# Patient Record
Sex: Female | Born: 1975 | Race: Black or African American | Hispanic: No | Marital: Married | Smoking: Current every day smoker
Health system: Southern US, Community
[De-identification: ages and names within clinical notes are randomized; demographics above are authoritative.]

## PROBLEM LIST (undated history)

## (undated) DIAGNOSIS — J45909 Unspecified asthma, uncomplicated: Secondary | ICD-10-CM

## (undated) DIAGNOSIS — T7840XA Allergy, unspecified, initial encounter: Secondary | ICD-10-CM

## (undated) DIAGNOSIS — F419 Anxiety disorder, unspecified: Secondary | ICD-10-CM

## (undated) DIAGNOSIS — K219 Gastro-esophageal reflux disease without esophagitis: Secondary | ICD-10-CM

## (undated) DIAGNOSIS — D509 Iron deficiency anemia, unspecified: Secondary | ICD-10-CM

## (undated) DIAGNOSIS — Z6841 Body Mass Index (BMI) 40.0 and over, adult: Secondary | ICD-10-CM

## (undated) DIAGNOSIS — F32A Depression, unspecified: Secondary | ICD-10-CM

## (undated) HISTORY — DX: Depression, unspecified: F32.A

## (undated) HISTORY — DX: Allergy, unspecified, initial encounter: T78.40XA

## (undated) HISTORY — PX: TUBAL LIGATION: SHX77

## (undated) HISTORY — DX: Iron deficiency anemia, unspecified: D50.9

## (undated) HISTORY — DX: Unspecified asthma, uncomplicated: J45.909

## (undated) HISTORY — PX: DILATION AND CURETTAGE OF UTERUS: SHX78

## (undated) HISTORY — PX: TONSILLECTOMY: SUR1361

## (undated) HISTORY — DX: Anxiety disorder, unspecified: F41.9

---

## 2005-12-14 ENCOUNTER — Emergency Department: Payer: Self-pay | Admitting: Unknown Physician Specialty

## 2008-10-02 ENCOUNTER — Emergency Department: Payer: Self-pay | Admitting: Emergency Medicine

## 2012-10-19 ENCOUNTER — Emergency Department: Payer: Self-pay | Admitting: Emergency Medicine

## 2012-10-19 LAB — CBC
HGB: 11.7 g/dL — ABNORMAL LOW (ref 12.0–16.0)
MCH: 24.3 pg — ABNORMAL LOW (ref 26.0–34.0)
MCV: 75 fL — ABNORMAL LOW (ref 80–100)
WBC: 10.2 10*3/uL (ref 3.6–11.0)

## 2012-10-19 LAB — PRO B NATRIURETIC PEPTIDE: B-Type Natriuretic Peptide: 60 pg/mL (ref 0–125)

## 2012-10-19 LAB — URINALYSIS, COMPLETE
Bacteria: NONE SEEN
Blood: NEGATIVE
Glucose,UR: NEGATIVE mg/dL (ref 0–75)
Ketone: NEGATIVE
Nitrite: NEGATIVE
Protein: NEGATIVE
Specific Gravity: 1.014 (ref 1.003–1.030)
Squamous Epithelial: <1
WBC UR: 1 /HPF (ref 0–5)

## 2012-10-19 LAB — TSH: Thyroid Stimulating Horm: 1.54 u[IU]/mL

## 2012-10-19 LAB — COMPREHENSIVE METABOLIC PANEL
Albumin: 2.9 g/dL — ABNORMAL LOW (ref 3.4–5.0)
Alkaline Phosphatase: 81 U/L (ref 50–136)
Anion Gap: 11 (ref 7–16)
BUN: 7 mg/dL (ref 7–18)
Calcium, Total: 8.2 mg/dL — ABNORMAL LOW (ref 8.5–10.1)
Chloride: 108 mmol/L — ABNORMAL HIGH (ref 98–107)
Creatinine: 0.75 mg/dL (ref 0.60–1.30)
Glucose: 91 mg/dL (ref 65–99)
Osmolality: 281 (ref 275–301)
Potassium: 3.7 mmol/L (ref 3.5–5.1)
Sodium: 142 mmol/L (ref 136–145)
Total Protein: 7.2 g/dL (ref 6.4–8.2)

## 2012-10-19 LAB — TROPONIN I: Troponin-I: <0.02 ng/mL

## 2014-05-28 ENCOUNTER — Ambulatory Visit: Payer: Self-pay

## 2015-06-14 ENCOUNTER — Encounter: Payer: Self-pay | Admitting: Emergency Medicine

## 2015-06-14 ENCOUNTER — Emergency Department
Admission: EM | Admit: 2015-06-14 | Discharge: 2015-06-14 | Disposition: A | Discharge status: Home / Self Care | Payer: Self-pay | Attending: Emergency Medicine | Admitting: Emergency Medicine

## 2015-06-14 DIAGNOSIS — R300 Dysuria: Secondary | ICD-10-CM | POA: Insufficient documentation

## 2015-06-14 DIAGNOSIS — Z720 Tobacco use: Secondary | ICD-10-CM | POA: Insufficient documentation

## 2015-06-14 DIAGNOSIS — R1030 Lower abdominal pain, unspecified: Secondary | ICD-10-CM | POA: Insufficient documentation

## 2015-06-14 LAB — URINALYSIS COMPLETE WITH MICROSCOPIC (ARMC ONLY)
Bilirubin Urine: NEGATIVE
Glucose, UA: NEGATIVE mg/dL
KETONES UR: NEGATIVE mg/dL
LEUKOCYTES UA: NEGATIVE
Nitrite: NEGATIVE
Protein, ur: NEGATIVE mg/dL
Specific Gravity, Urine: 1.021 (ref 1.005–1.030)
pH: 5 (ref 5.0–8.0)

## 2015-06-14 NOTE — ED Provider Notes (Signed)
[facility] Emergency Department Provider Note ____________________________________________  Time seen: 7:55 pm  I have reviewed the triage vital signs and the nursing notes.  HISTORY  Chief Complaint  Urinary Frequency  HPI Alexa Osborne is a 39 y.o. female with 3 days of urinary frequency, urgency, and dysuria. She denies any outright hematuria, or vaginal discharge. She describes some mild lower pelvic pain, but is not concerned with any sexually transmitted exposures. She denies any fever, chills, sweats, but notes some mild nausea without vomiting. She reports that her last missed her period was on June 27.  No past medical history on file.  There are no active problems to display for this patient.  No past surgical history on file.  No current outpatient prescriptions on file.  Allergies Review of patient's allergies indicates no known allergies.  No family history on file.  Social History History  Substance Use Topics  . Smoking status: Current Every Day Smoker -- 1.00 packs/day    Types: Cigarettes  . Smokeless tobacco: Never Used  . Alcohol Use: No   Review of Systems  Constitutional: Negative for fever. Eyes: Negative for visual changes. ENT: Negative for sore throat. Cardiovascular: Negative for chest pain. Respiratory: Negative for shortness of breath. Gastrointestinal: Negative for abdominal pain, vomiting and diarrhea. Genitourinary: Positive for dysuria. Denies vaginal discharge.  Musculoskeletal: Negative for back pain. Skin: Negative for rash. Neurological: Negative for headaches, focal weakness or numbness. ____________________________________________  PHYSICAL EXAM:  VITAL SIGNS: ED Triage Vitals  Enc Vitals Group     BP 06/14/15 1914 146/89 mmHg     Pulse Rate 06/14/15 1914 83     Resp 06/14/15 1914 22     Temp 06/14/15 1914 98.4 F (36.9 C)     Temp Source 06/14/15 1914 Oral     SpO2 06/14/15 1914 96 %      Weight 06/14/15 1914 350 lb (158.759 kg)     Height 06/14/15 1914 5\' 2"  (1.575 m)     Head Cir --      Peak Flow --      Pain Score 06/14/15 1915 8     Pain Loc --      Pain Edu? --      Excl. in [place]? --    Constitutional: Alert and oriented. Well appearing and in no distress. Eyes: Conjunctivae are normal. PERRL. Normal extraocular movements. ENT   Head: Normocephalic and atraumatic.   Nose: No congestion/rhinnorhea.   Mouth/Throat: Mucous membranes are moist.   Neck: Supple. No thyromegaly. Cardiovascular: Normal rate, regular rhythm.  Respiratory: Normal respiratory effort. No wheezes/rales/rhonchi. Gastrointestinal: Soft and nontender. No distention. No CVA tenderness GU:  Declined by patient Musculoskeletal: Nontender with normal range of motion in all extremities.  Neurologic:  Normal gait without ataxia. Normal speech and language. No gross focal neurologic deficits are appreciated. Skin:  Skin is warm, dry and intact. No rash noted. Psychiatric: Mood and affect are normal. Patient exhibits appropriate insight and judgment. ____________________________________________   LABS (pertinent positives/negatives) Labs Reviewed  URINALYSIS COMPLETEWITH MICROSCOPIC ([place] ONLY) - Abnormal; Notable for the following:    Color, Urine YELLOW (*)    APPearance CLEAR (*)    Hgb urine dipstick 2+ (*)    Bacteria, UA RARE (*)    Squamous Epithelial / LPF 0-5 (*)    All other components within normal limits   INITIAL IMPRESSION / ASSESSMENT AND PLAN / ED COURSE Dysuria without clinical indication of acute cystitis.  Patient declined  offer for pelvic exam and wet prep.  Will follow-up with Dr. Denton Lank, as scheduled on Tuesday.  Consider OTC Azo for symptom relief. ____________________________________________  FINAL CLINICAL IMPRESSION(S) / ED DIAGNOSES  Final diagnoses:  Dysuria     Melvenia Needles, PA-C 06/14/15 2055  Nena Polio, MD 06/15/15 [phone]

## 2015-06-14 NOTE — Discharge Instructions (Signed)
Dysuria Dysuria is the medical term for pain with urination. There are many causes for dysuria, but urinary tract infection is the most common. If a urinalysis was performed it can show that there is a urinary tract infection. A urine culture confirms that you or your child is sick. You will need to follow up with a healthcare provider because:  If a urine culture was done you will need to know the culture results and treatment recommendations.  If the urine culture was positive, you or your child will need to be put on antibiotics or know if the antibiotics prescribed are the right antibiotics for your urinary tract infection.  If the urine culture is negative (no urinary tract infection), then other causes may need to be explored or antibiotics need to be stopped. Today laboratory work may have been done and there does not seem to be an infection. If cultures were done they will take at least 24 to 48 hours to be completed. Today x-rays may have been taken and they read as normal. No cause can be found for the problems. The x-rays may be re-read by a radiologist and you will be contacted if additional findings are made. You or your child may have been put on medications to help with this problem until you can see your primary caregiver. If the problems get better, see your primary caregiver if the problems return. If you were given antibiotics (medications which kill germs), take all of the mediations as directed for the full course of treatment.  If laboratory work was done, you need to find the results. Leave a telephone number where you can be reached. If this is not possible, make sure you find out how you are to get test results. HOME CARE INSTRUCTIONS   Drink lots of fluids. For adults, drink eight, 8 ounce glasses of clear juice or water a day. For children, replace fluids as suggested by your caregiver.  Empty the bladder often. Avoid holding urine for long periods of time.  After a bowel  movement, women should cleanse front to back, using each tissue only once.  Empty your bladder before and after sexual intercourse.  Take all the medicine given to you until it is gone. You may feel better in a few days, but TAKE ALL MEDICINE.  Avoid caffeine, tea, alcohol and carbonated beverages, because they tend to irritate the bladder.  In men, alcohol may irritate the prostate.  Only take over-the-counter or prescription medicines for pain, discomfort, or fever as directed by your caregiver.  If your caregiver has given you a follow-up appointment, it is very important to keep that appointment. Not keeping the appointment could result in a chronic or permanent injury, pain, and disability. If there is any problem keeping the appointment, you must call back to this facility for assistance. SEEK IMMEDIATE MEDICAL CARE IF:   Back pain develops.  A fever develops.  There is nausea (feeling sick to your stomach) or vomiting (throwing up).  Problems are no better with medications or are getting worse. MAKE SURE YOU:   Understand these instructions.  Will watch your condition.  Will get help right away if you are not doing well or get worse. Document Released: 08/26/2004 Document Revised: 02/20/2012 Document Reviewed: 07/03/2008 [facility] Patient Information 2015 [place], [place]. This information is not intended to replace advice given to you by your health care provider. Make sure you discuss any questions you have with your health care provider.  Increase fluid intake  and empty bladder on schedule.  Consider taking OTC Azo for bladder discomfort.  You should keep your appointment with Dr. Posey Pronto as scheduled for further care & management.

## 2015-06-14 NOTE — ED Notes (Signed)
Patient reports burning, frequency and lower abd pain.

## 2017-01-23 ENCOUNTER — Emergency Department
Admission: EM | Admit: 2017-01-23 | Discharge: 2017-01-23 | Disposition: A | Discharge status: Home / Self Care | Payer: Self-pay | Attending: Emergency Medicine | Admitting: Emergency Medicine

## 2017-01-23 ENCOUNTER — Encounter: Payer: Self-pay | Admitting: Emergency Medicine

## 2017-01-23 DIAGNOSIS — B9689 Other specified bacterial agents as the cause of diseases classified elsewhere: Secondary | ICD-10-CM

## 2017-01-23 DIAGNOSIS — F17210 Nicotine dependence, cigarettes, uncomplicated: Secondary | ICD-10-CM | POA: Insufficient documentation

## 2017-01-23 DIAGNOSIS — N760 Acute vaginitis: Secondary | ICD-10-CM | POA: Insufficient documentation

## 2017-01-23 DIAGNOSIS — R1030 Lower abdominal pain, unspecified: Secondary | ICD-10-CM

## 2017-01-23 DIAGNOSIS — N898 Other specified noninflammatory disorders of vagina: Secondary | ICD-10-CM

## 2017-01-23 LAB — WET PREP, GENITAL
Sperm: NONE SEEN
Trich, Wet Prep: NONE SEEN
Yeast Wet Prep HPF POC: NONE SEEN

## 2017-01-23 LAB — URINALYSIS, COMPLETE (UACMP) WITH MICROSCOPIC
BACTERIA UA: NONE SEEN
Bilirubin Urine: NEGATIVE
GLUCOSE, UA: NEGATIVE mg/dL
HGB URINE DIPSTICK: NEGATIVE
KETONES UR: NEGATIVE mg/dL
Leukocytes, UA: NEGATIVE
NITRITE: NEGATIVE
PROTEIN: NEGATIVE mg/dL
Specific Gravity, Urine: 1.02 (ref 1.005–1.030)
pH: 5 (ref 5.0–8.0)

## 2017-01-23 LAB — COMPREHENSIVE METABOLIC PANEL
ALBUMIN: 3.8 g/dL (ref 3.5–5.0)
ALT: 10 U/L — ABNORMAL LOW (ref 14–54)
ANION GAP: 6 (ref 5–15)
AST: 13 U/L — ABNORMAL LOW (ref 15–41)
Alkaline Phosphatase: 59 U/L (ref 38–126)
BUN: 13 mg/dL (ref 6–20)
CALCIUM: 9 mg/dL (ref 8.9–10.3)
CO2: 24 mmol/L (ref 22–32)
Chloride: 106 mmol/L (ref 101–111)
Creatinine, Ser: 0.73 mg/dL (ref 0.44–1.00)
GFR calc non Af Amer: >60 mL/min (ref >60)
GLUCOSE: 90 mg/dL (ref 65–99)
POTASSIUM: 4.1 mmol/L (ref 3.5–5.1)
SODIUM: 136 mmol/L (ref 135–145)
Total Bilirubin: 0.4 mg/dL (ref 0.3–1.2)
Total Protein: 7.3 g/dL (ref 6.5–8.1)

## 2017-01-23 LAB — CBC
HEMATOCRIT: 39 % (ref 35.0–47.0)
HEMOGLOBIN: 12.9 g/dL (ref 12.0–16.0)
MCH: 26.4 pg (ref 26.0–34.0)
MCHC: 33.2 g/dL (ref 32.0–36.0)
MCV: 79.5 fL — ABNORMAL LOW (ref 80.0–100.0)
Platelets: 209 10*3/uL (ref 150–440)
RBC: 4.9 MIL/uL (ref 3.80–5.20)
RDW: 14.7 % — ABNORMAL HIGH (ref 11.5–14.5)
WBC: 13.3 10*3/uL — ABNORMAL HIGH (ref 3.6–11.0)

## 2017-01-23 LAB — LIPASE, BLOOD: LIPASE: 34 U/L (ref 11–51)

## 2017-01-23 LAB — POCT PREGNANCY, URINE: PREG TEST UR: NEGATIVE

## 2017-01-23 LAB — CHLAMYDIA/NGC RT PCR (ARMC ONLY)
Chlamydia Tr: NOT DETECTED
N gonorrhoeae: NOT DETECTED

## 2017-01-23 MED ORDER — ACETAMINOPHEN 500 MG PO TABS
1000.0000 mg | ORAL_TABLET | Freq: Once | ORAL | Status: AC
  Administered 2017-01-23: 1000 mg via ORAL
  Filled 2017-01-23: qty 2

## 2017-01-23 MED ORDER — FLUCONAZOLE 150 MG PO TABS: 150.0000 mg | ORAL_TABLET | Freq: Every day | ORAL | 0 refills | Status: AC

## 2017-01-23 MED ORDER — METRONIDAZOLE 500 MG PO TABS: 500.0000 mg | ORAL_TABLET | Freq: Two times a day (BID) | ORAL | 0 refills | Status: DC

## 2017-01-23 NOTE — ED Provider Notes (Signed)
[facility] Emergency Department Provider Note  ____________________________________________  Time seen: Approximately 10:37 AM  I have reviewed the triage vital signs and the nursing notes.   HISTORY  Chief Complaint Abdominal Pain    HPI Alexa Osborne is a 41 y.o. female , nonpregnant, who is sexually active with one female partner, remote history of STD, presenting with 1 week of intermittent mild lower abdominal pain, and increased white colored vaginal discharge. The patient denies any fever, chills, nausea vomiting or diarrhea. She has not had dysuria.No improvement with Motrin.   History reviewed. No pertinent past medical history.  There are no active problems to display for this patient.   History reviewed. No pertinent surgical history.    Allergies Patient has no known allergies.  No family history on file.  Social History Social History  Substance Use Topics  . Smoking status: Current Every Day Smoker    Packs/day: 1.00    Types: Cigarettes  . Smokeless tobacco: Never Used  . Alcohol use No    Review of Systems Constitutional: No fever/chills.No lightheadedness or syncope. Eyes: No visual changes. ENT: No sore throat. No congestion or rhinorrhea. Cardiovascular: Denies chest pain. Denies palpitations. Respiratory: Denies shortness of breath.  No cough. Gastrointestinal: Positive lower abdominal pain.  No nausea, no vomiting.  No diarrhea.  No constipation. Genitourinary: Negative for dysuria. No urinary frequency. Positive white vaginal discharge. Musculoskeletal: Negative for back pain. Skin: Negative for rash. Neurological: Negative for headaches. No focal numbness, tingling or weakness.   10-point ROS otherwise negative.  ____________________________________________   PHYSICAL EXAM:  VITAL SIGNS: ED Triage Vitals  Enc Vitals Group     BP 01/23/17 0814 116/62     Pulse Rate 01/23/17 0814 79     Resp 01/23/17 0814  18     Temp 01/23/17 0814 98.3 F (36.8 C)     Temp Source 01/23/17 0814 Oral     SpO2 01/23/17 0814 99 %     Weight 01/23/17 0815 279 lb (126.6 kg)     Height 01/23/17 0815 5\' 2"  (1.575 m)     Head Circumference --      Peak Flow --      Pain Score 01/23/17 0823 6     Pain Loc --      Pain Edu? --      Excl. in [place]? --     Constitutional: Alert and oriented. Well appearing and in no acute distress. Answers questions appropriately. Eyes: Conjunctivae are normal.  EOMI. No scleral icterus. Head: Atraumatic. Nose: No congestion/rhinnorhea. Mouth/Throat: Mucous membranes are moist.  Neck: No stridor.  Supple.   Cardiovascular: Normal rate, regular rhythm. No murmurs, rubs or gallops.  Respiratory: Normal respiratory effort.  No accessory muscle use or retractions. Lungs CTAB.  No wheezes, rales or ronchi. Gastrointestinal: Morbidly obese Soft, nontender and nondistended.  I'm unable to reproduce any pain with palpation. No guarding or rebound.  No peritoneal signs. Genitourinary: Normal-appearing external genitalia without lesions. Vaginal exam with thin white discharge, normal-appearing cervix, normal vaginal wall tissue. Bimanual exam is negative for CMT, adnexal tenderness to palpation, no palpable masses.  Mild ttp in the suprapubic area. Musculoskeletal: No LE edema.  Neurologic:  A&Ox3.  Speech is clear.  Face and smile are symmetric.  EOMI.  Moves all extremities well. Skin:  Skin is warm, dry and intact. No rash noted. Psychiatric: Mood and affect are normal. Speech and behavior are normal.  Normal judgement.  ____________________________________________  LABS (all labs ordered are listed, but only abnormal results are displayed)  Labs Reviewed  WET PREP, GENITAL - Abnormal; Notable for the following:       Result Value   Clue Cells Wet Prep HPF POC PRESENT (*)    WBC, Wet Prep HPF POC MODERATE (*)    All other components within normal limits  COMPREHENSIVE METABOLIC  PANEL - Abnormal; Notable for the following:    AST 13 (*)    ALT 10 (*)    All other components within normal limits  CBC - Abnormal; Notable for the following:    WBC 13.3 (*)    MCV 79.5 (*)    RDW 14.7 (*)    All other components within normal limits  URINALYSIS, COMPLETE (UACMP) WITH MICROSCOPIC - Abnormal; Notable for the following:    Color, Urine YELLOW (*)    APPearance CLEAR (*)    Squamous Epithelial / LPF 0-5 (*)    All other components within normal limits  CHLAMYDIA/NGC RT PCR ([facility] ONLY)  LIPASE, BLOOD  POC URINE PREG, ED  POCT PREGNANCY, URINE   ____________________________________________  EKG  Not indicated ____________________________________________  RADIOLOGY  No results found.  ____________________________________________   PROCEDURES  Procedure(s) performed: None  Procedures  Critical Care performed: No ____________________________________________   INITIAL IMPRESSION / ASSESSMENT AND PLAN / ED COURSE  Pertinent labs & imaging results that were available during my care of the patient were reviewed by me and considered in my medical decision making (see chart for details).  41 y.o. female presenting with intermittent lower abdominal pain, increased white vaginal discharge times one week. Overall, the patient has a reassuring examination and stable vital signs. Her laboratory studies do show an elevation of her white blood cells to 13.3, but no evidence of UTI or other electrolyte abnormalities. Her pregnancy test is also negative. We will perform pelvic examination with cultures, and I will treat the patient with Tylenol for her pain.  ----------------------------------------- 12:44 PM on 01/23/2017 -----------------------------------------  The patient's wet prep does show clue cells, so treated her with Flagyl for a week for bacterial vaginosis. She has these results. Her GC and chlamydia are still pending but she is unwilling to  wait for the results and will have her gynecologist follow these up. She understands return precautions as well as follow-up instructions. She has requested a tablet of Diflucan as she is prone to yeast infections with other antibiotics. ____________________________________________  FINAL CLINICAL IMPRESSION(S) / ED DIAGNOSES  Final diagnoses:  Vaginal discharge  Lower abdominal pain  Bacterial vaginosis         NEW MEDICATIONS STARTED DURING THIS VISIT:  New Prescriptions   FLUCONAZOLE (DIFLUCAN) 150 MG TABLET    Take 1 tablet (150 mg total) by mouth daily.   METRONIDAZOLE (FLAGYL) 500 MG TABLET    Take 1 tablet (500 mg total) by mouth 2 (two) times daily.      Eula Listen, MD 01/23/17 1245

## 2017-01-23 NOTE — Discharge Instructions (Signed)
Please take the entire course of Flagyl, even if you're feeling better. You may take Tylenol or Motrin for your abdominal discomfort. Please have your gynecologist follow up the results of your gonorrhea and chlamydia testing e positive or one of those STDs, he may be at risk for other STDs including HIV. Please have your doctor check you for HIV. Please refrain from any sexual intercourse until you have the results of all of your studies and your partner has been tested and treated as necessary.  Return to the emergency department if you develop severe pain, vomiting, fever, or any other

## 2017-01-23 NOTE — ED Triage Notes (Signed)
Lower abdominal pain x 1 week. Denies fevers, States has white vaginal discharge.

## 2017-02-26 ENCOUNTER — Emergency Department
Admission: EM | Admit: 2017-02-26 | Discharge: 2017-02-26 | Disposition: A | Discharge status: Home / Self Care | Payer: Self-pay | Attending: Emergency Medicine | Admitting: Emergency Medicine

## 2017-02-26 ENCOUNTER — Encounter: Payer: Self-pay | Admitting: Emergency Medicine

## 2017-02-26 DIAGNOSIS — F17210 Nicotine dependence, cigarettes, uncomplicated: Secondary | ICD-10-CM | POA: Insufficient documentation

## 2017-02-26 DIAGNOSIS — J019 Acute sinusitis, unspecified: Secondary | ICD-10-CM | POA: Insufficient documentation

## 2017-02-26 MED ORDER — FLUCONAZOLE 150 MG PO TABS: 150.0000 mg | ORAL_TABLET | Freq: Every day | ORAL | 0 refills | Status: DC

## 2017-02-26 MED ORDER — PREDNISONE 10 MG (21) PO TBPK: ORAL_TABLET | ORAL | 0 refills | Status: DC

## 2017-02-26 MED ORDER — AMOXICILLIN-POT CLAVULANATE 875-125 MG PO TABS: 1.0000 | ORAL_TABLET | Freq: Two times a day (BID) | ORAL | 0 refills | Status: AC

## 2017-02-26 NOTE — ED Triage Notes (Signed)
Pt states that she has had cough and congestion, bilateral ear pain, sore throat, and chills off and on for 2 weeks. Pt states that her symptoms are getting worse. Pt in no acute distress at this time

## 2017-02-26 NOTE — ED Notes (Addendum)
See triage note  States she developed cough, congestion, ear pain for about 2 weeks  Sx;shave changed in intensity  Feels like she is getting worse over the past couple of day  Afebrile on arrival

## 2017-02-26 NOTE — ED Provider Notes (Signed)
[facility] Emergency Department Provider Note  ____________________________________________  Time seen: Approximately 9:16 AM  I have reviewed the triage vital signs and the nursing notes.   HISTORY  Chief Complaint Cough    HPI Alexa Osborne is a 41 y.o. female presents to emergency department with 2 weeks of congestion, runny nose, ear pain. Patient states that it hurts over her cheeks and forehead. Patient states that drainage is going down the back of her throat and in the last 3 days has developed a sore throat and cough. Patient is coughing up white sputum. She states that she frequently she gets a "head cold" but they usually resolve by now. Patient has used over-the-counter medications without relief. She denies fever, headache, shortness of breath, chest pain, nausea, vomiting, abdominal pain, diarrhea, constipation.   History reviewed. No pertinent past medical history.  There are no active problems to display for this patient.   Past Surgical History:  Procedure Laterality Date  . DILATION AND CURETTAGE OF UTERUS    . TONSILLECTOMY    . TUBAL LIGATION      Prior to Admission medications   Medication Sig Start Date End Date Taking? Authorizing Provider  amoxicillin-clavulanate (AUGMENTIN) 875-125 MG tablet Take 1 tablet by mouth 2 (two) times daily. 02/26/17 03/08/17  Laban Emperor, PA-C  fluconazole (DIFLUCAN) 150 MG tablet Take 1 tablet (150 mg total) by mouth daily. 02/26/17   Laban Emperor, PA-C  metroNIDAZOLE (FLAGYL) 500 MG tablet Take 1 tablet (500 mg total) by mouth 2 (two) times daily. 01/23/17   Anne-Caroline Mariea Clonts, MD  predniSONE (STERAPRED UNI-PAK 21 TAB) 10 MG (21) TBPK tablet Take 6 tablets on day 1, take 5 tablets on day 2, take 4 tablets on day 3, take 3 tablets on day 4, take 2 tablets on day 5, take 1 tablet on day 6 02/26/17   Laban Emperor, PA-C    Allergies Patient has no known allergies.  No family history on  file.  Social History Social History  Substance Use Topics  . Smoking status: Current Every Day Smoker    Packs/day: 1.00    Types: Cigarettes  . Smokeless tobacco: Never Used  . Alcohol use No     Review of Systems  Constitutional: No fever/chills Eyes: No visual changes. No discharge. ENT: Positive for congestion and rhinorrhea. Cardiovascular: No chest pain. Respiratory: Positive for cough. No SOB. Gastrointestinal: No abdominal pain.  No nausea, no vomiting.  No diarrhea.  No constipation. Musculoskeletal: Negative for musculoskeletal pain. Skin: Negative for rash, abrasions, lacerations, ecchymosis. Neurological: Negative for headaches.   ____________________________________________   PHYSICAL EXAM:  VITAL SIGNS: ED Triage Vitals  Enc Vitals Group     BP 02/26/17 0853 130/78     Pulse Rate 02/26/17 0853 89     Resp 02/26/17 0853 16     Temp 02/26/17 0853 97.9 F (36.6 C)     Temp Source 02/26/17 0853 Oral     SpO2 02/26/17 0853 97 %     Weight 02/26/17 0851 275 lb (124.7 kg)     Height 02/26/17 0851 5\' 3"  (1.6 m)     Head Circumference --      Peak Flow --      Pain Score 02/26/17 0851 8     Pain Loc --      Pain Edu? --      Excl. in [place]? --      Constitutional: Alert and oriented. Well appearing and in no acute  distress. Eyes: Conjunctivae are normal. PERRL. EOMI. No discharge. Head: Atraumatic. ENT: Positive for frontal and maxillary sinus tenderness.      Ears: Tympanic membranes pearly gray with good landmarks. No discharge.      Nose: Mild congestion/rhinnorhea.      Mouth/Throat: Mucous membranes are moist. Oropharynx non-erythematous. Tonsils not enlarged. No exudates. Uvula midline. Neck: No stridor.   Hematological/Lymphatic/Immunilogical: No cervical lymphadenopathy. Cardiovascular: Normal rate, regular rhythm.  Good peripheral circulation. Respiratory: Normal respiratory effort without tachypnea or retractions. Lungs CTAB. Good air entry to  the bases with no decreased or absent breath sounds. Musculoskeletal: Full range of motion to all extremities. No gross deformities appreciated. Neurologic:  Normal speech and language. No gross focal neurologic deficits are appreciated.  Skin:  Skin is warm, dry and intact. No rash noted. Psychiatric: Mood and affect are normal.    ____________________________________________   LABS (all labs ordered are listed, but only abnormal results are displayed)  Labs Reviewed - No data to display ____________________________________________  EKG   ____________________________________________  RADIOLOGY  No results found.  ____________________________________________    PROCEDURES  Procedure(s) performed:    Procedures    Medications - No data to display   ____________________________________________   INITIAL IMPRESSION / ASSESSMENT AND PLAN / ED COURSE  Pertinent labs & imaging results that were available during my care of the patient were reviewed by me and considered in my medical decision making (see chart for details).  Review of the [place] CSRS was performed in accordance of the [place] prior to dispensing any controlled drugs.     Patient's diagnosis is consistent with sinus infection. Vital signs and exam are reassuring. Patient has not had a fever and is afebrile in ED. I do not think there is no indication for imaging at this time. Patient appears well and is staying well hydrated.  Patient feels comfortable going home. Patient will be discharged home with prescriptions for Augmentin and prednisone. Patient states that she gets yeast infections from Augmentin so a prescription for Diflucan was provided. Patient is to follow up with PCP as needed or otherwise directed. Patient is given ED precautions to return to the ED for any worsening or new symptoms.     ____________________________________________  FINAL CLINICAL IMPRESSION(S) / ED DIAGNOSES  Final  diagnoses:  Acute non-recurrent sinusitis, unspecified location      NEW MEDICATIONS STARTED DURING THIS VISIT:  New Prescriptions   AMOXICILLIN-CLAVULANATE (AUGMENTIN) 875-125 MG TABLET    Take 1 tablet by mouth 2 (two) times daily.   FLUCONAZOLE (DIFLUCAN) 150 MG TABLET    Take 1 tablet (150 mg total) by mouth daily.   PREDNISONE (STERAPRED UNI-PAK 21 TAB) 10 MG (21) TBPK TABLET    Take 6 tablets on day 1, take 5 tablets on day 2, take 4 tablets on day 3, take 3 tablets on day 4, take 2 tablets on day 5, take 1 tablet on day 6        This chart was dictated using voice recognition software/Dragon. Despite best efforts to proofread, errors can occur which can change the meaning. Any change was purely unintentional.    Laban Emperor, PA-C 02/26/17 3875    Daymon Larsen, MD 02/26/17 [phone]

## 2017-12-01 ENCOUNTER — Emergency Department
Admission: EM | Admit: 2017-12-01 | Discharge: 2017-12-01 | Disposition: A | Discharge status: Home / Self Care | Payer: Self-pay | Attending: Emergency Medicine | Admitting: Emergency Medicine

## 2017-12-01 DIAGNOSIS — K2970 Gastritis, unspecified, without bleeding: Secondary | ICD-10-CM | POA: Insufficient documentation

## 2017-12-01 DIAGNOSIS — Z79899 Other long term (current) drug therapy: Secondary | ICD-10-CM | POA: Insufficient documentation

## 2017-12-01 DIAGNOSIS — F17210 Nicotine dependence, cigarettes, uncomplicated: Secondary | ICD-10-CM | POA: Insufficient documentation

## 2017-12-01 LAB — COMPREHENSIVE METABOLIC PANEL
ALT: 18 U/L (ref 14–54)
AST: 20 U/L (ref 15–41)
Albumin: 3.5 g/dL (ref 3.5–5.0)
Alkaline Phosphatase: 72 U/L (ref 38–126)
Anion gap: 6 (ref 5–15)
BUN: 14 mg/dL (ref 6–20)
CHLORIDE: 107 mmol/L (ref 101–111)
CO2: 26 mmol/L (ref 22–32)
CREATININE: 0.7 mg/dL (ref 0.44–1.00)
Calcium: 8.8 mg/dL — ABNORMAL LOW (ref 8.9–10.3)
Glucose, Bld: 94 mg/dL (ref 65–99)
POTASSIUM: 4 mmol/L (ref 3.5–5.1)
Sodium: 139 mmol/L (ref 135–145)
Total Bilirubin: 0.5 mg/dL (ref 0.3–1.2)
Total Protein: 7.1 g/dL (ref 6.5–8.1)

## 2017-12-01 LAB — CBC
HEMATOCRIT: 41 % (ref 35.0–47.0)
Hemoglobin: 13.5 g/dL (ref 12.0–16.0)
MCH: 25.9 pg — ABNORMAL LOW (ref 26.0–34.0)
MCHC: 32.9 g/dL (ref 32.0–36.0)
MCV: 78.9 fL — AB (ref 80.0–100.0)
Platelets: 232 10*3/uL (ref 150–440)
RBC: 5.19 MIL/uL (ref 3.80–5.20)
RDW: 14.6 % — ABNORMAL HIGH (ref 11.5–14.5)
WBC: 9.4 10*3/uL (ref 3.6–11.0)

## 2017-12-01 LAB — URINALYSIS, COMPLETE (UACMP) WITH MICROSCOPIC
BILIRUBIN URINE: NEGATIVE
Bacteria, UA: NONE SEEN
GLUCOSE, UA: NEGATIVE mg/dL
KETONES UR: NEGATIVE mg/dL
LEUKOCYTES UA: NEGATIVE
NITRITE: NEGATIVE
PH: 7 (ref 5.0–8.0)
Protein, ur: NEGATIVE mg/dL
Specific Gravity, Urine: 1.013 (ref 1.005–1.030)

## 2017-12-01 LAB — LIPASE, BLOOD: LIPASE: 35 U/L (ref 11–51)

## 2017-12-01 LAB — POCT PREGNANCY, URINE: Preg Test, Ur: NEGATIVE

## 2017-12-01 MED ORDER — ONDANSETRON 4 MG PO TBDP: 4.0000 mg | ORAL_TABLET | Freq: Three times a day (TID) | ORAL | 0 refills | Status: DC | PRN

## 2017-12-01 MED ORDER — PANTOPRAZOLE SODIUM 40 MG PO TBEC: 40.0000 mg | DELAYED_RELEASE_TABLET | Freq: Every day | ORAL | 0 refills | Status: DC

## 2017-12-01 NOTE — ED Provider Notes (Signed)
[facility] Emergency Department Provider Note   ____________________________________________    I have reviewed the triage vital signs and the nursing notes.   HISTORY  Chief Complaint Abdominal Pain     HPI Alexa Osborne is a 41 y.o. female presents with complaints of nausea and vomiting.  Patient reports over the last several months she has become very limited in what she can eat because most foods will immediately make her feel nauseous after eating.  She is able to tolerate oatmeal and liquids now.  She denies abdominal pain.  No fevers or chills.  Denies significant weight loss.  No recent travel.  Normal stools.  Has not taken anything for this   History reviewed. No pertinent past medical history.  There are no active problems to display for this patient.   Past Surgical History:  Procedure Laterality Date  . DILATION AND CURETTAGE OF UTERUS    . TONSILLECTOMY    . TUBAL LIGATION      Prior to Admission medications   Medication Sig Start Date End Date Taking? Authorizing Provider  fluconazole (DIFLUCAN) 150 MG tablet Take 1 tablet (150 mg total) by mouth daily. 02/26/17   Laban Emperor, PA-C  metroNIDAZOLE (FLAGYL) 500 MG tablet Take 1 tablet (500 mg total) by mouth 2 (two) times daily. 01/23/17   Eula Listen, MD  ondansetron (ZOFRAN ODT) 4 MG disintegrating tablet Take 1 tablet (4 mg total) by mouth every 8 (eight) hours as needed for nausea or vomiting. 12/01/17   Lavonia Drafts, MD  pantoprazole (PROTONIX) 40 MG tablet Take 1 tablet (40 mg total) by mouth daily. 12/01/17 12/31/17  Lavonia Drafts, MD  predniSONE (STERAPRED UNI-PAK 21 TAB) 10 MG (21) TBPK tablet Take 6 tablets on day 1, take 5 tablets on day 2, take 4 tablets on day 3, take 3 tablets on day 4, take 2 tablets on day 5, take 1 tablet on day 6 02/26/17   Laban Emperor, PA-C     Allergies Patient has no known allergies.  No family history on file.  Social  History Social History   Tobacco Use  . Smoking status: Current Every Day Smoker    Packs/day: 1.00    Types: Cigarettes  . Smokeless tobacco: Never Used  Substance Use Topics  . Alcohol use: No  . Drug use: No    Review of Systems  Constitutional: No fever/chills Eyes: No visual changes.  ENT: No sore throat Cardiovascular: Denies chest pain. Respiratory: No cough Gastrointestinal: As above Genitourinary: Negative for dysuria. Musculoskeletal: Negative for back pain. Skin: Negative for rash. Neurological: Negative for headaches    ____________________________________________   PHYSICAL EXAM:  VITAL SIGNS: ED Triage Vitals  Enc Vitals Group     BP 12/01/17 0804 123/72     Pulse Rate 12/01/17 0804 84     Resp 12/01/17 0804 16     Temp 12/01/17 0804 98.1 F (36.7 C)     Temp Source 12/01/17 0804 Oral     SpO2 12/01/17 0804 94 %     Weight 12/01/17 0804 (!) 138.8 kg (306 lb)     Height 12/01/17 0804 1.575 m (5\' 2" )     Head Circumference --      Peak Flow --      Pain Score 12/01/17 0927 0     Pain Loc --      Pain Edu? --      Excl. in [place]? --     Constitutional:  Alert and oriented. No acute distress. Pleasant and interactive Eyes: Conjunctivae are normal.   Nose: No congestion/rhinnorhea. Mouth/Throat: Mucous membranes are moist.    Cardiovascular: Normal rate, regular rhythm. Grossly normal heart sounds.  Good peripheral circulation. Respiratory: Normal respiratory effort.  No retractions. Lungs CTAB. Gastrointestinal: Soft and nontender. No distention.  No CVA tenderness. Genitourinary: deferred Musculoskeletal:.  Warm and well perfused Neurologic:  Normal speech and language. No gross focal neurologic deficits are appreciated.  Skin:  Skin is warm, dry and intact. No rash noted. Psychiatric: Mood and affect are normal. Speech and behavior are normal.  ____________________________________________   LABS (all labs ordered are listed, but only  abnormal results are displayed)  Labs Reviewed  COMPREHENSIVE METABOLIC PANEL - Abnormal; Notable for the following components:      Result Value   Calcium 8.8 (*)    All other components within normal limits  CBC - Abnormal; Notable for the following components:   MCV 78.9 (*)    MCH 25.9 (*)    RDW 14.6 (*)    All other components within normal limits  URINALYSIS, COMPLETE (UACMP) WITH MICROSCOPIC - Abnormal; Notable for the following components:   Color, Urine YELLOW (*)    APPearance CLEAR (*)    Hgb urine dipstick LARGE (*)    Squamous Epithelial / LPF 0-5 (*)    All other components within normal limits  LIPASE, BLOOD  POC URINE PREG, ED  POCT PREGNANCY, URINE   ____________________________________________  EKG  None ____________________________________________  RADIOLOGY  None ____________________________________________   PROCEDURES  Procedure(s) performed: No  Procedures   Critical Care performed: No ____________________________________________   INITIAL IMPRESSION / ASSESSMENT AND PLAN / ED COURSE  Pertinent labs & imaging results that were available during my care of the patient were reviewed by me and considered in my medical decision making (see chart for details).  Patient very well-appearing in no acute distress.  Vital signs are unremarkable.  Lab work is reassuring.  She is currently mentating.  Differential diagnosis includes gastritis/PUD/acid reflux, not consistent with cholelithiasis or pancreatitis given no pain.  Will trial PPI and if no improvement in 1 week I have asked her to closely follow-up with PCP/GI for further more extensive evaluation    ____________________________________________   FINAL CLINICAL IMPRESSION(S) / ED DIAGNOSES  Final diagnoses:  Gastritis without bleeding, unspecified chronicity, unspecified gastritis type        Note:  This document was prepared using Dragon voice recognition software and may  include unintentional dictation errors.    Lavonia Drafts, MD 12/01/17 [phone]

## 2017-12-01 NOTE — ED Triage Notes (Signed)
Pt reports abdominal pain, n/v, decreased appetite. Denies fever. Has been going on since the summer per pt, worsening.

## 2017-12-01 NOTE — ED Notes (Signed)
Ongoing intermittent generalized abdominal pain for months. Worsening nausea and vomiting over last week after eating. No abdominal pain at this time. Pt alert and oriented X4, active, cooperative, pt in NAD. RR even and unlabored, color WNL.

## 2018-05-25 ENCOUNTER — Emergency Department: Payer: BLUE CROSS/BLUE SHIELD

## 2018-05-25 ENCOUNTER — Other Ambulatory Visit: Payer: Self-pay

## 2018-05-25 ENCOUNTER — Emergency Department
Admission: EM | Admit: 2018-05-25 | Discharge: 2018-05-25 | Disposition: A | Discharge status: Home / Self Care | Payer: BLUE CROSS/BLUE SHIELD | Attending: Emergency Medicine | Admitting: Emergency Medicine

## 2018-05-25 ENCOUNTER — Encounter: Payer: Self-pay | Admitting: Emergency Medicine

## 2018-05-25 DIAGNOSIS — J069 Acute upper respiratory infection, unspecified: Secondary | ICD-10-CM | POA: Insufficient documentation

## 2018-05-25 DIAGNOSIS — J209 Acute bronchitis, unspecified: Secondary | ICD-10-CM | POA: Diagnosis not present

## 2018-05-25 DIAGNOSIS — F17210 Nicotine dependence, cigarettes, uncomplicated: Secondary | ICD-10-CM | POA: Insufficient documentation

## 2018-05-25 DIAGNOSIS — B9789 Other viral agents as the cause of diseases classified elsewhere: Secondary | ICD-10-CM

## 2018-05-25 DIAGNOSIS — R05 Cough: Secondary | ICD-10-CM | POA: Diagnosis present

## 2018-05-25 MED ORDER — IPRATROPIUM-ALBUTEROL 0.5-2.5 (3) MG/3ML IN SOLN
3.0000 mL | Freq: Once | RESPIRATORY_TRACT | Status: AC
  Administered 2018-05-25: 3 mL via RESPIRATORY_TRACT
  Filled 2018-05-25: qty 3

## 2018-05-25 MED ORDER — PREDNISONE 10 MG (21) PO TBPK: ORAL_TABLET | ORAL | 0 refills | Status: DC

## 2018-05-25 MED ORDER — ALBUTEROL SULFATE HFA 108 (90 BASE) MCG/ACT IN AERS: 2.0000 | INHALATION_SPRAY | Freq: Four times a day (QID) | RESPIRATORY_TRACT | 2 refills | Status: DC | PRN

## 2018-05-25 NOTE — ED Notes (Signed)
See triage note  Presents with headache,congestion and prod cough since yesterday  Denies any fever

## 2018-05-25 NOTE — ED Triage Notes (Signed)
Pt reports cough and congestion started yesterday afternoon, states that she thinks it may be bronchitis

## 2018-05-25 NOTE — ED Provider Notes (Signed)
[facility] Emergency Department Provider Note   ____________________________________________   First MD Initiated Contact with Patient 05/25/18 [phone]     (approximate)  I have reviewed the triage vital signs and the nursing notes.   HISTORY  Chief Complaint Cough  HPI Alexa Osborne is a 42 y.o. female is here with complaint of congestion and cough that began yesterday.  Patient is unaware of any fever and denies chills.  She has taken Mucinex and Robitussin wants without any relief.  Patient states that she had asthma as a child.  She is currently a smoker.  Rates her discomfort as 7 out of 10.  History reviewed. No pertinent past medical history.  There are no active problems to display for this patient.   Past Surgical History:  Procedure Laterality Date  . DILATION AND CURETTAGE OF UTERUS    . TONSILLECTOMY    . TUBAL LIGATION      Prior to Admission medications   Medication Sig Start Date End Date Taking? Authorizing Provider  albuterol (PROVENTIL HFA;VENTOLIN HFA) 108 (90 Base) MCG/ACT inhaler Inhale 2 puffs into the lungs every 6 (six) hours as needed for wheezing or shortness of breath. 05/25/18   Johnn Hai, PA-C  pantoprazole (PROTONIX) 40 MG tablet Take 1 tablet (40 mg total) by mouth daily. 12/01/17 12/31/17  Lavonia Drafts, MD  predniSONE (STERAPRED UNI-PAK 21 TAB) 10 MG (21) TBPK tablet Take 6 tablets on day 1, take 5 tablets on day 2, take 4 tablets on day 3, take 3 tablets on day 4, take 2 tablets on day 5, take 1 tablet on day 6 05/25/18   Johnn Hai, PA-C    Allergies Patient has no known allergies.  No family history on file.  Social History Social History   Tobacco Use  . Smoking status: Current Every Day Smoker    Packs/day: 1.00    Types: Cigarettes  . Smokeless tobacco: Never Used  Substance Use Topics  . Alcohol use: No  . Drug use: No    Review of Systems Constitutional: No fever/chills Eyes: No  visual changes. ENT: No sore throat. Cardiovascular: Denies chest pain. Respiratory: Denies shortness of breath.  Positive cough and wheezing. Gastrointestinal:No nausea, no vomiting. Musculoskeletal: Negative for muscle aches. Skin: Negative for rash. Neurological: Negative for headaches, focal weakness or numbness. ____________________________________________   PHYSICAL EXAM:  VITAL SIGNS: ED Triage Vitals  Enc Vitals Group     BP 05/25/18 0754 140/74     Pulse Rate 05/25/18 0754 92     Resp 05/25/18 0754 18     Temp 05/25/18 0754 98.6 F (37 C)     Temp Source 05/25/18 0754 Oral     SpO2 05/25/18 0754 99 %     Weight 05/25/18 0756 294 lb (133.4 kg)     Height 05/25/18 0756 5\' 2"  (1.575 m)     Head Circumference --      Peak Flow --      Pain Score 05/25/18 0756 7     Pain Loc --      Pain Edu? --      Excl. in [place]? --    Constitutional: Alert and oriented. Well appearing and in no acute distress. Eyes: Conjunctivae are normal.  Head: Atraumatic. Nose: Mild congestion/rhinnorhea. Mouth/Throat: Mucous membranes are moist.  Oropharynx non-erythematous. Neck: No stridor.   Hematological/Lymphatic/Immunilogical: No cervical lymphadenopathy. Cardiovascular: Normal rate, regular rhythm. Grossly normal heart sounds.  Good peripheral circulation. Respiratory: Normal  respiratory effort.  No retractions. Lungs expiratory wheezes are heard throughout.  Patient is able to speak in complete sentences without any difficulty. Musculoskeletal: No lower extremity tenderness nor edema.  No joint effusions. Neurologic:  Normal speech and language. No gross focal neurologic deficits are appreciated. No gait instability. Skin:  Skin is warm, dry and intact. No rash noted. Psychiatric: Mood and affect are normal. Speech and behavior are normal.  ____________________________________________   LABS (all labs ordered are listed, but only abnormal results are displayed)  Labs Reviewed - No  data to display  RADIOLOGY  ED MD interpretation:  Chest x-ray was negative for infiltrate or disease.  Official radiology report(s): Dg Chest 2 View  Result Date: 05/25/2018 CLINICAL DATA:  Shortness of breath with cough and wheezing EXAM: CHEST - 2 VIEW COMPARISON:  October 19, 2012 FINDINGS: There is no edema or consolidation. The heart size and pulmonary vascularity are normal. No adenopathy. No bone lesions evident. IMPRESSION: No edema or consolidation. Electronically Signed   By: Lowella Grip III M.D.   On: 05/25/2018 09:21    ____________________________________________   PROCEDURES  Procedure(s) performed: None  Procedures  Critical Care performed: No  ____________________________________________   INITIAL IMPRESSION / ASSESSMENT AND PLAN / ED COURSE  As part of my medical decision making, I reviewed the following data within the electronic MEDICAL RECORD NUMBER Notes from prior ED visits and [place] Controlled Substance Database  Patient improved with a DuoNeb treatment while in the ED.  She was reassured that chest x-ray was negative for pneumonia.  Patient was started on prednisone 60 mg 6-day taper and an albuterol inhaler.  Patient is to follow-up with her PCP if any continued problems or concerns.  She is encouraged to decrease or stop smoking.  ____________________________________________   FINAL CLINICAL IMPRESSION(S) / ED DIAGNOSES  Final diagnoses:  Viral URI with cough  Acute bronchitis, unspecified organism     ED Discharge Orders        Ordered    predniSONE (STERAPRED UNI-PAK 21 TAB) 10 MG (21) TBPK tablet     05/25/18 0934    albuterol (PROVENTIL HFA;VENTOLIN HFA) 108 (90 Base) MCG/ACT inhaler  Every 6 hours PRN     05/25/18 0934       Note:  This document was prepared using Dragon voice recognition software and may include unintentional dictation errors.    Johnn Hai, PA-C 05/25/18 1104    Lavonia Drafts, MD 05/25/18 [phone]

## 2018-05-25 NOTE — Discharge Instructions (Signed)
Follow-up with your primary care provider if any continued problems.  Begin using your albuterol inhaler every 6 hours as needed for wheezing.  Begin prednisone today as directed.  Discontinue smoking or cut back drastically as this will aggravate your breathing at this time.

## 2018-10-18 ENCOUNTER — Emergency Department
Admission: EM | Admit: 2018-10-18 | Discharge: 2018-10-18 | Disposition: A | Discharge status: Home / Self Care | Payer: BLUE CROSS/BLUE SHIELD | Attending: Emergency Medicine | Admitting: Emergency Medicine

## 2018-10-18 ENCOUNTER — Other Ambulatory Visit: Payer: Self-pay

## 2018-10-18 DIAGNOSIS — F17210 Nicotine dependence, cigarettes, uncomplicated: Secondary | ICD-10-CM | POA: Diagnosis not present

## 2018-10-18 DIAGNOSIS — R05 Cough: Secondary | ICD-10-CM | POA: Diagnosis not present

## 2018-10-18 DIAGNOSIS — L02811 Cutaneous abscess of head [any part, except face]: Secondary | ICD-10-CM | POA: Diagnosis present

## 2018-10-18 DIAGNOSIS — R0981 Nasal congestion: Secondary | ICD-10-CM | POA: Diagnosis not present

## 2018-10-18 DIAGNOSIS — R058 Other specified cough: Secondary | ICD-10-CM

## 2018-10-18 DIAGNOSIS — L739 Follicular disorder, unspecified: Secondary | ICD-10-CM | POA: Diagnosis not present

## 2018-10-18 DIAGNOSIS — Z79899 Other long term (current) drug therapy: Secondary | ICD-10-CM | POA: Insufficient documentation

## 2018-10-18 MED ORDER — FEXOFENADINE-PSEUDOEPHED ER 60-120 MG PO TB12: 1.0000 | ORAL_TABLET | Freq: Two times a day (BID) | ORAL | 0 refills | Status: DC

## 2018-10-18 MED ORDER — NAPROXEN 500 MG PO TABS: 500.0000 mg | ORAL_TABLET | Freq: Two times a day (BID) | ORAL | Status: DC

## 2018-10-18 MED ORDER — SULFAMETHOXAZOLE-TRIMETHOPRIM 800-160 MG PO TABS: 1.0000 | ORAL_TABLET | Freq: Two times a day (BID) | ORAL | 0 refills | Status: DC

## 2018-10-18 NOTE — ED Provider Notes (Signed)
[facility] Emergency Department Provider Note   ____________________________________________   First MD Initiated Contact with Patient 10/18/18 0845     (approximate)  I have reviewed the triage vital signs and the nursing notes.   HISTORY  Chief Complaint Abscess    HPI Alexa Osborne is a 42 y.o. female patient presents with a tender swollen area to the right lateral occipital portion of the scalp.  Patient state onset of complaint was 2 to 3 days ago.  Patient denies fever or drainage.  Patient states increased headache but she believes this is secondary to her sinus congestion.  Patient denies vision loss or vertigo.  Patient rates the pain as a 9/10.  Patient described the pain is "aching".  No palliative measures for complaint.  History reviewed. No pertinent past medical history.  There are no active problems to display for this patient.   Past Surgical History:  Procedure Laterality Date  . DILATION AND CURETTAGE OF UTERUS    . TONSILLECTOMY    . TUBAL LIGATION      Prior to Admission medications   Medication Sig Start Date End Date Taking? Authorizing Provider  albuterol (PROVENTIL HFA;VENTOLIN HFA) 108 (90 Base) MCG/ACT inhaler Inhale 2 puffs into the lungs every 6 (six) hours as needed for wheezing or shortness of breath. 05/25/18   Johnn Hai, PA-C  fexofenadine-pseudoephedrine (ALLEGRA-D) 60-120 MG 12 hr tablet Take 1 tablet by mouth 2 (two) times daily. 10/18/18   Sable Feil, PA-C  naproxen (NAPROSYN) 500 MG tablet Take 1 tablet (500 mg total) by mouth 2 (two) times daily with a meal. 10/18/18   Sable Feil, PA-C  pantoprazole (PROTONIX) 40 MG tablet Take 1 tablet (40 mg total) by mouth daily. 12/01/17 12/31/17  Lavonia Drafts, MD  predniSONE (STERAPRED UNI-PAK 21 TAB) 10 MG (21) TBPK tablet Take 6 tablets on day 1, take 5 tablets on day 2, take 4 tablets on day 3, take 3 tablets on day 4, take 2 tablets on day 5, take 1  tablet on day 6 05/25/18   Johnn Hai, PA-C  sulfamethoxazole-trimethoprim (BACTRIM DS,SEPTRA DS) 800-160 MG tablet Take 1 tablet by mouth 2 (two) times daily. 10/18/18   Sable Feil, PA-C    Allergies Patient has no known allergies.  No family history on file.  Social History Social History   Tobacco Use  . Smoking status: Current Every Day Smoker    Packs/day: 1.00    Types: Cigarettes  . Smokeless tobacco: Never Used  Substance Use Topics  . Alcohol use: No  . Drug use: No    Review of Systems Constitutional: No fever/chills Eyes: No visual changes. ENT: No sore throat. Cardiovascular: Denies chest pain. Respiratory: Denies shortness of breath. Gastrointestinal: No abdominal pain.  No nausea, no vomiting.  No diarrhea.  No constipation. Genitourinary: Negative for dysuria. Musculoskeletal: Negative for back pain. Skin: Nodule lesion occipital area of scalp. Neurological: Negative for headaches, focal weakness or numbness.   ____________________________________________   PHYSICAL EXAM:  VITAL SIGNS: ED Triage Vitals  Enc Vitals Group     BP 10/18/18 0831 122/86     Pulse Rate 10/18/18 0831 80     Resp 10/18/18 0831 18     Temp 10/18/18 0831 98.6 F (37 C)     Temp Source 10/18/18 0831 Oral     SpO2 10/18/18 0831 96 %     Weight 10/18/18 0832 296 lb (134.3 kg)  Height 10/18/18 0832 5\' 2"  (1.575 m)     Head Circumference --      Peak Flow --      Pain Score 10/18/18 0832 9     Pain Loc --      Pain Edu? --      Excl. in [place]? --    Constitutional: Alert and oriented. Well appearing and in no acute distress. Nose: Bilateral maxillary guarding with edematous nasal turbinates. Mouth/Throat: Mucous membranes are moist.  Oropharynx non-erythematous. Hematological/Lymphatic/Immunilogical: No cervical lymphadenopathy. Cardiovascular: Normal rate, regular rhythm. Grossly normal heart sounds.  Good peripheral circulation. Respiratory: Normal  respiratory effort.  No retractions. Lungs CTAB. Neurologic:  Normal speech and language. No gross focal neurologic deficits are appreciated. No gait instability. Skin: Erythematous nonerythematous nodule lesion occipital area of scalp.  Psychiatric: Mood and affect are normal. Speech and behavior are normal.  ____________________________________________   LABS (all labs ordered are listed, but only abnormal results are displayed)  Labs Reviewed - No data to display ____________________________________________  EKG   ____________________________________________  RADIOLOGY  ED MD interpretation:    Official radiology report(s): No results found.  ____________________________________________   PROCEDURES  Procedure(s) performed: None  Procedures  Critical Care performed: No  ____________________________________________   INITIAL IMPRESSION / ASSESSMENT AND PLAN / ED COURSE  As part of my medical decision making, I reviewed the following data within the electronic MEDICAL RECORD NUMBER    Painful nausea lesion secondary to folliculitis.  Patient given discharge care instruction.  Patient advised take medication as directed.  Patient advised follow-up PCP if condition persists.      ____________________________________________   FINAL CLINICAL IMPRESSION(S) / ED DIAGNOSES  Final diagnoses:  Folliculitis  Cough with congestion of paranasal sinus     ED Discharge Orders         Ordered    sulfamethoxazole-trimethoprim (BACTRIM DS,SEPTRA DS) 800-160 MG tablet  2 times daily     10/18/18 0852    naproxen (NAPROSYN) 500 MG tablet  2 times daily with meals     10/18/18 0852    fexofenadine-pseudoephedrine (ALLEGRA-D) 60-120 MG 12 hr tablet  2 times daily     10/18/18 9242           Note:  This document was prepared using Dragon voice recognition software and may include unintentional dictation errors.    Sable Feil, PA-C 10/18/18 [place], [place], MD 10/23/18 1344

## 2018-10-18 NOTE — ED Triage Notes (Signed)
Pt c/o tender swollen area to the back of her head for the past couple of days

## 2018-10-18 NOTE — ED Notes (Signed)
See triage note  States she developed some swelling to right side of head  States area became more swollen   And now having some sinus pressure and discomfort in r ight ear

## 2018-12-14 ENCOUNTER — Encounter: Payer: Self-pay | Admitting: Emergency Medicine

## 2018-12-14 ENCOUNTER — Other Ambulatory Visit: Payer: Self-pay

## 2018-12-14 ENCOUNTER — Emergency Department
Admission: EM | Admit: 2018-12-14 | Discharge: 2018-12-14 | Disposition: A | Discharge status: Home / Self Care | Payer: BLUE CROSS/BLUE SHIELD | Attending: Emergency Medicine | Admitting: Emergency Medicine

## 2018-12-14 DIAGNOSIS — Z79899 Other long term (current) drug therapy: Secondary | ICD-10-CM | POA: Diagnosis not present

## 2018-12-14 DIAGNOSIS — K6289 Other specified diseases of anus and rectum: Secondary | ICD-10-CM | POA: Diagnosis present

## 2018-12-14 DIAGNOSIS — F17210 Nicotine dependence, cigarettes, uncomplicated: Secondary | ICD-10-CM | POA: Insufficient documentation

## 2018-12-14 DIAGNOSIS — K602 Anal fissure, unspecified: Secondary | ICD-10-CM | POA: Diagnosis not present

## 2018-12-14 HISTORY — DX: Gastro-esophageal reflux disease without esophagitis: K21.9

## 2018-12-14 MED ORDER — LIDOCAINE HCL URETHRAL/MUCOSAL 2 % EX GEL: 1.0000 "application " | Freq: Three times a day (TID) | CUTANEOUS | 1 refills | Status: DC

## 2018-12-14 MED ORDER — KETOROLAC TROMETHAMINE 30 MG/ML IJ SOLN
30.0000 mg | Freq: Once | INTRAMUSCULAR | Status: AC
  Administered 2018-12-14: 30 mg via INTRAVENOUS
  Filled 2018-12-14: qty 1

## 2018-12-14 MED ORDER — NITROGLYCERIN 0.4 % RE OINT: 1.0000 "application " | TOPICAL_OINTMENT | Freq: Two times a day (BID) | RECTAL | 0 refills | Status: DC

## 2018-12-14 MED ORDER — LIDOCAINE VISCOUS HCL 2 % MT SOLN
15.0000 mL | Freq: Once | OROMUCOSAL | Status: AC
Start: 2018-12-14 — End: 2018-12-14
  Administered 2018-12-14: 15 mL via OROMUCOSAL

## 2018-12-14 NOTE — ED Provider Notes (Signed)
[facility] Emergency Department Provider Note   ____________________________________________   I have reviewed the triage vital signs and the nursing notes.   HISTORY  Chief Complaint Rectal Pain   History limited by: Not Limited   HPI Alexa Osborne is a 43 y.o. female who presents to the emergency department today because of concern for rectal pain. The patient states that the pain started 2 days ago. Located at the anus. The patient states she had been dealing with a significant amount of diarrhea. She did take an immodium and then became constipated. Stated she did have to pass a large bowel movement. The pain is severe. It is worse with bowel movements. The patient states she thinks she had the norovirus that started everything. She did have some generalized abdominal pain. The patient also has noticed some red blood. States she thinks she had a similar thing happened once before.    Per medical record review patient has a history of acid reflux.   Past Medical History:  Diagnosis Date  . Acid reflux     There are no active problems to display for this patient.   Past Surgical History:  Procedure Laterality Date  . DILATION AND CURETTAGE OF UTERUS    . TONSILLECTOMY    . TUBAL LIGATION      Prior to Admission medications   Medication Sig Start Date End Date Taking? Authorizing Provider  albuterol (PROVENTIL HFA;VENTOLIN HFA) 108 (90 Base) MCG/ACT inhaler Inhale 2 puffs into the lungs every 6 (six) hours as needed for wheezing or shortness of breath. 05/25/18   Johnn Hai, PA-C  fexofenadine-pseudoephedrine (ALLEGRA-D) 60-120 MG 12 hr tablet Take 1 tablet by mouth 2 (two) times daily. 10/18/18   Sable Feil, PA-C  naproxen (NAPROSYN) 500 MG tablet Take 1 tablet (500 mg total) by mouth 2 (two) times daily with a meal. 10/18/18   Sable Feil, PA-C  pantoprazole (PROTONIX) 40 MG tablet Take 1 tablet (40 mg total) by mouth daily.  12/01/17 12/31/17  Lavonia Drafts, MD  predniSONE (STERAPRED UNI-PAK 21 TAB) 10 MG (21) TBPK tablet Take 6 tablets on day 1, take 5 tablets on day 2, take 4 tablets on day 3, take 3 tablets on day 4, take 2 tablets on day 5, take 1 tablet on day 6 05/25/18   Johnn Hai, PA-C  sulfamethoxazole-trimethoprim (BACTRIM DS,SEPTRA DS) 800-160 MG tablet Take 1 tablet by mouth 2 (two) times daily. 10/18/18   Sable Feil, PA-C    Allergies Patient has no known allergies.  History reviewed. No pertinent family history.  Social History Social History   Tobacco Use  . Smoking status: Current Every Day Smoker    Packs/day: 1.00    Types: Cigarettes  . Smokeless tobacco: Never Used  Substance Use Topics  . Alcohol use: No  . Drug use: No    Review of Systems Constitutional: No fever/chills Eyes: No visual changes. ENT: No sore throat. Cardiovascular: Denies chest pain. Respiratory: Denies shortness of breath. Gastrointestinal: Positive for generalized abdominal pain, anal pain.   Genitourinary: Negative for dysuria. Musculoskeletal: Negative for back pain. Skin: Negative for rash. Neurological: Negative for headaches, focal weakness or numbness.  ____________________________________________   PHYSICAL EXAM:  VITAL SIGNS: ED Triage Vitals  Enc Vitals Group     BP 12/14/18 1755 (!) 161/116     Pulse Rate 12/14/18 1755 87     Resp 12/14/18 1755 17     Temp 12/14/18 1755  98.7 F (37.1 C)     Temp Source 12/14/18 1755 Oral     SpO2 12/14/18 1755 97 %     Weight --      Height 12/14/18 1757 5\' 2"  (1.575 m)     Head Circumference --      Peak Flow --      Pain Score 12/14/18 1756 7   Constitutional: Alert and oriented.  Eyes: Conjunctivae are normal.  ENT      Head: Normocephalic and atraumatic.      Nose: No congestion/rhinnorhea.      Mouth/Throat: Mucous membranes are moist.      Neck: No stridor. Hematological/Lymphatic/Immunilogical: No cervical  lymphadenopathy. Cardiovascular: Normal rate, regular rhythm.  No murmurs, rubs, or gallops. Respiratory: Normal respiratory effort without tachypnea nor retractions. Breath sounds are clear and equal bilaterally. No wheezes/rales/rhonchi. Gastrointestinal: Soft and non tender. No rebound. No guarding.  Rectal: No bright red blood on glove. Extremely tender to exam. No hemorrhoid.  Musculoskeletal: Normal range of motion in all extremities. No lower extremity edema. Neurologic:  Normal speech and language. No gross focal neurologic deficits are appreciated.  Skin:  Skin is warm, dry and intact. No rash noted. Psychiatric: Mood and affect are normal. Speech and behavior are normal. Patient exhibits appropriate insight and judgment.  ____________________________________________    LABS (pertinent positives/negatives)  None  ____________________________________________   EKG  I, Nance Pear, attending physician, personally viewed and interpreted this EKG  EKG Time: 1757 Rate: 92 Rhythm: sinus rhythm Axis: normal Intervals: qtc 448 QRS: incomplete RBBB and LAFB ST changes: no st elevation Impression: abnormal ekg   ____________________________________________    RADIOLOGY  None  ____________________________________________   PROCEDURES  Procedures  ____________________________________________   INITIAL IMPRESSION / ASSESSMENT AND PLAN / ED COURSE  Pertinent labs & imaging results that were available during my care of the patient were reviewed by me and considered in my medical decision making (see chart for details).   Patient presented to the emergency department today because of concerns for anal pain.  She also had some bleeding.  On exam no blood identified on the glove however patient was extremely tender to palpation of the anal muscle.  This point I think anal fissure unlikely.  No hemorrhoids appreciated.  Did discuss this with the patient.  Will plan on  treating for anal fissure.  Did give patient GI follow-up.   ____________________________________________   FINAL CLINICAL IMPRESSION(S) / ED DIAGNOSES  Final diagnoses:  Anal fissure     Note: This dictation was prepared with Dragon dictation. Any transcriptional errors that result from this process are unintentional     Nance Pear, MD 12/14/18 1919

## 2018-12-14 NOTE — Discharge Instructions (Addendum)
Please seek medical attention for any high fevers, chest pain, shortness of breath, change in behavior, persistent vomiting, bloody stool or any other new or concerning symptoms.  

## 2018-12-14 NOTE — ED Notes (Signed)
Pt was given 100 mcgs of Fentanyl for pain by EMS. Pt states that she is currently pain free.

## 2018-12-14 NOTE — ED Notes (Signed)
Peripheral IV discontinued. Catheter intact. No signs of infiltration or redness. Gauze applied to IV site.    Discharge instructions reviewed with patient. Questions fielded by this RN. Patient verbalizes understanding of instructions. Patient discharged home in stable condition per [facility]. No acute distress noted at time of discharge.    

## 2018-12-14 NOTE — ED Triage Notes (Signed)
Pt reports that she has diarrhea (Norovirus at her work) a few days ago took Immodium and became constipation, then took milk of magnesia for  The constipation and is now having diarrhea again. States that when she goes to the bathroom she is seeing bright red blood.

## 2019-11-15 ENCOUNTER — Other Ambulatory Visit: Payer: Self-pay

## 2019-11-15 DIAGNOSIS — Z20822 Contact with and (suspected) exposure to covid-19: Secondary | ICD-10-CM

## 2019-11-16 LAB — NOVEL CORONAVIRUS, NAA: SARS-CoV-2, NAA: NOT DETECTED

## 2020-01-10 ENCOUNTER — Ambulatory Visit: Payer: BLUE CROSS/BLUE SHIELD | Attending: Internal Medicine

## 2020-01-10 DIAGNOSIS — Z20822 Contact with and (suspected) exposure to covid-19: Secondary | ICD-10-CM | POA: Insufficient documentation

## 2020-01-11 LAB — NOVEL CORONAVIRUS, NAA: SARS-CoV-2, NAA: NOT DETECTED

## 2020-11-13 ENCOUNTER — Encounter: Payer: Self-pay | Admitting: Adult Health

## 2020-11-13 ENCOUNTER — Ambulatory Visit (INDEPENDENT_AMBULATORY_CARE_PROVIDER_SITE_OTHER): Payer: 59 | Admitting: Adult Health

## 2020-11-13 ENCOUNTER — Ambulatory Visit
Admission: RE | Admit: 2020-11-13 | Discharge: 2020-11-13 | Disposition: A | Discharge status: Home / Self Care | Payer: 59 | Source: Ambulatory Visit | Attending: Adult Health | Admitting: Adult Health

## 2020-11-13 ENCOUNTER — Other Ambulatory Visit: Payer: Self-pay

## 2020-11-13 ENCOUNTER — Ambulatory Visit
Admission: RE | Admit: 2020-11-13 | Discharge: 2020-11-13 | Disposition: A | Discharge status: Home / Self Care | Payer: 59 | Attending: Adult Health | Admitting: Adult Health

## 2020-11-13 VITALS — BP 121/82 | HR 85 | Temp 98.0°F | Resp 16 | Ht 63.0 in | Wt 333.2 lb

## 2020-11-13 DIAGNOSIS — M25572 Pain in left ankle and joints of left foot: Secondary | ICD-10-CM

## 2020-11-13 DIAGNOSIS — K219 Gastro-esophageal reflux disease without esophagitis: Secondary | ICD-10-CM | POA: Diagnosis not present

## 2020-11-13 DIAGNOSIS — E559 Vitamin D deficiency, unspecified: Secondary | ICD-10-CM | POA: Diagnosis not present

## 2020-11-13 DIAGNOSIS — M25562 Pain in left knee: Secondary | ICD-10-CM | POA: Insufficient documentation

## 2020-11-13 DIAGNOSIS — Z1389 Encounter for screening for other disorder: Secondary | ICD-10-CM | POA: Diagnosis not present

## 2020-11-13 DIAGNOSIS — J4520 Mild intermittent asthma, uncomplicated: Secondary | ICD-10-CM

## 2020-11-13 DIAGNOSIS — Z1231 Encounter for screening mammogram for malignant neoplasm of breast: Secondary | ICD-10-CM

## 2020-11-13 DIAGNOSIS — Z91199 Patient's noncompliance with other medical treatment and regimen due to unspecified reason: Secondary | ICD-10-CM | POA: Insufficient documentation

## 2020-11-13 DIAGNOSIS — G8929 Other chronic pain: Secondary | ICD-10-CM

## 2020-11-13 DIAGNOSIS — Z1211 Encounter for screening for malignant neoplasm of colon: Secondary | ICD-10-CM | POA: Insufficient documentation

## 2020-11-13 LAB — POCT URINALYSIS DIPSTICK
Bilirubin, UA: NEGATIVE
Blood, UA: NEGATIVE
Glucose, UA: NEGATIVE
Ketones, UA: NEGATIVE
Leukocytes, UA: NEGATIVE
Nitrite, UA: NEGATIVE
Protein, UA: NEGATIVE
Spec Grav, UA: <=1.005 — AB (ref 1.010–1.025)
Urobilinogen, UA: 0.2 E.U./dL
pH, UA: 7.5 (ref 5.0–8.0)

## 2020-11-13 MED ORDER — ALBUTEROL SULFATE HFA 108 (90 BASE) MCG/ACT IN AERS
1.0000 | INHALATION_SPRAY | Freq: Four times a day (QID) | RESPIRATORY_TRACT | 1 refills | Status: DC | PRN
Start: 2020-11-13 — End: 2021-02-26

## 2020-11-13 MED ORDER — NEOMYCIN-POLYMYXIN-HC 3.5-10000-1 OT SUSP: 3.0000 [drp] | Freq: Four times a day (QID) | OTIC | 0 refills | Status: DC

## 2020-11-13 MED ORDER — ESCITALOPRAM OXALATE 10 MG PO TABS
10.0000 mg | ORAL_TABLET | Freq: Every day | ORAL | 0 refills | Status: DC
Start: 2020-11-13 — End: 2020-12-18

## 2020-11-13 MED ORDER — PANTOPRAZOLE SODIUM 40 MG PO TBEC: 40.0000 mg | DELAYED_RELEASE_TABLET | Freq: Every day | ORAL | 1 refills | Status: DC

## 2020-11-13 MED ORDER — FLUCONAZOLE 150 MG PO TABS: 150.0000 mg | ORAL_TABLET | ORAL | 0 refills | Status: DC

## 2020-11-13 MED ORDER — AMOXICILLIN-POT CLAVULANATE 875-125 MG PO TABS: 1.0000 | ORAL_TABLET | Freq: Two times a day (BID) | ORAL | 0 refills | Status: DC

## 2020-11-13 NOTE — Progress Notes (Deleted)
No show for new patient visit 11/13/2020.

## 2020-11-13 NOTE — Patient Instructions (Addendum)
Call to schedule your screening mammogram. Your orders have been placed for your exam.  Let our office know if you have questions, concerns, or any difficulty scheduling.  If normal results then yearly screening mammograms are recommended unless you notice  Changes in your breast then you should schedule a follow up office visit. If abnormal results  Further imaging will be warranted and sooner follow up as determined by the radiologist at the [facility].   [facility] [place], [place] [address]  Main: [phone]       Living With Depression Everyone experiences occasional disappointment, sadness, and loss in their lives. When you are feeling down, blue, or sad for at least 2 weeks in a row, it may mean that you have depression. Depression can affect your thoughts and feelings, relationships, daily activities, and physical health. It is caused by changes in the way your brain functions. If you receive a diagnosis of depression, your health care provider will tell you which type of depression you have and what treatment options are available to you. If you are living with depression, there are ways to help you recover from it and also ways to prevent it from coming back. How to cope with lifestyle changes Coping with stress     Stress is your body's reaction to life changes and events, both good and bad. Stressful situations may include:  Getting married.  The death of a spouse.  Losing a job.  Retiring.  Having a baby. Stress can last just a few hours or it can be ongoing. Stress can play a major role in depression, so it is important to learn both how to cope with stress and how to think about it differently. Talk with your health care provider or a counselor if you would like to learn more about stress reduction. He or she may suggest some stress reduction techniques, such as:  Music therapy. This can include creating  music or listening to music. Choose music that you enjoy and that inspires you.  Mindfulness-based meditation. This kind of meditation can be done while sitting or walking. It involves being aware of your normal breaths, rather than trying to control your breathing.  Centering prayer. This is a kind of meditation that involves focusing on a spiritual word or phrase. Choose a word, phrase, or sacred image that is meaningful to you and that brings you peace.  Deep breathing. To do this, expand your stomach and inhale slowly through your nose. Hold your breath for 3-5 seconds, then exhale slowly, allowing your stomach muscles to relax.  Muscle relaxation. This involves intentionally tensing muscles then relaxing them. Choose a stress reduction technique that fits your lifestyle and personality. Stress reduction techniques take time and practice to develop. Set aside 5-15 minutes a day to do them. Therapists can offer training in these techniques. The training may be covered by some insurance plans. Other things you can do to manage stress include:  Keeping a stress diary. This can help you learn what triggers your stress and ways to control your response.  Understanding what your limits are and saying no to requests or events that lead to a schedule that is too full.  Thinking about how you respond to certain situations. You may not be able to control everything, but you can control how you react.  Adding humor to your life by watching funny films or TV shows.  Making time for activities that help  you relax and not feeling guilty about spending your time this way.  Medicines Your health care provider may suggest certain medicines if he or she feels that they will help improve your condition. Avoid using alcohol and other substances that may prevent your medicines from working properly (may interact). It is also important to:  Talk with your pharmacist or health care provider about all the  medicines that you take, their possible side effects, and what medicines are safe to take together.  Make it your goal to take part in all treatment decisions (shared decision-making). This includes giving input on the side effects of medicines. It is best if shared decision-making with your health care provider is part of your total treatment plan. If your health care provider prescribes a medicine, you may not notice the full benefits of it for 4-8 weeks. Most people who are treated for depression need to be on medicine for at least 6-12 months after they feel better. If you are taking medicines as part of your treatment, do not stop taking medicines without first talking to your health care provider. You may need to have the medicine slowly decreased (tapered) over time to decrease the risk of harmful side effects. Relationships Your health care provider may suggest family therapy along with individual therapy and drug therapy. While there may not be family problems that are causing you to feel depressed, it is still important to make sure your family learns as much as they can about your mental health. Having your family's support can help make your treatment successful. How to recognize changes in your condition Everyone has a different response to treatment for depression. Recovery from major depression happens when you have not had signs of major depression for two months. This may mean that you will start to:  Have more interest in doing activities.  Feel less hopeless than you did 2 months ago.  Have more energy.  Overeat less often, or have better or improving appetite.  Have better concentration. Your health care provider will work with you to decide the next steps in your recovery. It is also important to recognize when your condition is getting worse. Watch for these signs:  Having fatigue or low energy.  Eating too much or too little.  Sleeping too much or too little.  Feeling  restless, agitated, or hopeless.  Having trouble concentrating or making decisions.  Having unexplained physical complaints.  Feeling irritable, angry, or aggressive. Get help as soon as you or your family members notice these symptoms coming back. How to get support and help from others How to talk with friends and family members about your condition  Talking to friends and family members about your condition can provide you with one way to get support and guidance. Reach out to trusted friends or family members, explain your symptoms to them, and let them know that you are working with a health care provider to treat your depression. Financial resources Not all insurance plans cover mental health care, so it is important to check with your insurance carrier. If paying for co-pays or counseling services is a problem, search for a local or county mental health care center. They may be able to offer public mental health care services at low or no cost when you are not able to see a private health care provider. If you are taking medicine for depression, you may be able to get the generic form, which may be less expensive. Some makers of prescription  medicines also offer help to patients who cannot afford the medicines they need. Follow these instructions at home:   Get the right amount and quality of sleep.  Cut down on using caffeine, tobacco, alcohol, and other potentially harmful substances.  Try to exercise, such as walking or lifting small weights.  Take over-the-counter and prescription medicines only as told by your health care provider.  Eat a healthy diet that includes plenty of vegetables, fruits, whole grains, low-fat dairy products, and lean protein. Do not eat a lot of foods that are high in solid fats, added sugars, or salt.  Keep all follow-up visits as told by your health care provider. This is important. Contact a health care provider if:  You stop taking your  antidepressant medicines, and you have any of these symptoms: ? Nausea. ? Headache. ? Feeling lightheaded. ? Chills and body aches. ? Not being able to sleep (insomnia).  You or your friends and family think your depression is getting worse. Get help right away if:  You have thoughts of hurting yourself or others. If you ever feel like you may hurt yourself or others, or have thoughts about taking your own life, get help right away. You can go to your nearest emergency department or call:  Your local emergency services (911 in the [place]  A suicide crisis helpline, such as the [place] at 334-639-7302. This is open 24-hours a day. Summary  If you are living with depression, there are ways to help you recover from it and also ways to prevent it from coming back.  Work with your health care team to create a management plan that includes counseling, stress management techniques, and healthy lifestyle habits. This information is not intended to replace advice given to you by your health care provider. Make sure you discuss any questions you have with your health care provider. Document Revised: 03/22/2019 Document Reviewed: 10/31/2016 Elsevier Patient Education  2020 [place] Maintenance, Female Adopting a healthy lifestyle and getting preventive care are important in promoting health and wellness. Ask your health care provider about:  The right schedule for you to have regular tests and exams.  Things you can do on your own to prevent diseases and keep yourself healthy. What should I know about diet, weight, and exercise? Eat a healthy diet   Eat a diet that includes plenty of vegetables, fruits, low-fat dairy products, and lean protein.  Do not eat a lot of foods that are high in solid fats, added sugars, or sodium. Maintain a healthy weight Body mass index (BMI) is used to identify weight problems. It estimates body fat based on height  and weight. Your health care provider can help determine your BMI and help you achieve or maintain a healthy weight. Get regular exercise Get regular exercise. This is one of the most important things you can do for your health. Most adults should:  Exercise for at least 150 minutes each week. The exercise should increase your heart rate and make you sweat (moderate-intensity exercise).  Do strengthening exercises at least twice a week. This is in addition to the moderate-intensity exercise.  Spend less time sitting. Even light physical activity can be beneficial. Watch cholesterol and blood lipids Have your blood tested for lipids and cholesterol at 46 years of age, then have this test every 5 years. Have your cholesterol levels checked more often if:  Your lipid or cholesterol levels are high.  You are older than 46 years of  age.  Dennis Bast are at high risk for heart disease. What should I know about cancer screening? Depending on your health history and family history, you may need to have cancer screening at various ages. This may include screening for:  Breast cancer.  Cervical cancer.  Colorectal cancer.  Skin cancer.  Lung cancer. What should I know about heart disease, diabetes, and high blood pressure? Blood pressure and heart disease  High blood pressure causes heart disease and increases the risk of stroke. This is more likely to develop in people who have high blood pressure readings, are of African descent, or are overweight.  Have your blood pressure checked: ? Every 3-5 years if you are 24-59 years of age. ? Every year if you are 72 years old or older. Diabetes Have regular diabetes screenings. This checks your fasting blood sugar level. Have the screening done:  Once every three years after age 57 if you are at a normal weight and have a low risk for diabetes.  More often and at a younger age if you are overweight or have a high risk for diabetes. What should I  know about preventing infection? Hepatitis B If you have a higher risk for hepatitis B, you should be screened for this virus. Talk with your health care provider to find out if you are at risk for hepatitis B infection. Hepatitis C Testing is recommended for:  Everyone born from [birth year] through 1967.  Anyone with known risk factors for hepatitis C. Sexually transmitted infections (STIs)  Get screened for STIs, including gonorrhea and chlamydia, if: ? You are sexually active and are younger than 46 years of age. ? You are older than 46 years of age and your health care provider tells you that you are at risk for this type of infection. ? Your sexual activity has changed since you were last screened, and you are at increased risk for chlamydia or gonorrhea. Ask your health care provider if you are at risk.  Ask your health care provider about whether you are at high risk for HIV. Your health care provider may recommend a prescription medicine to help prevent HIV infection. If you choose to take medicine to prevent HIV, you should first get tested for HIV. You should then be tested every 3 months for as long as you are taking the medicine. Pregnancy  If you are about to stop having your period (premenopausal) and you may become pregnant, seek counseling before you get pregnant.  Take 400 to 800 micrograms (mcg) of folic acid every day if you become pregnant.  Ask for birth control (contraception) if you want to prevent pregnancy. Osteoporosis and menopause Osteoporosis is a disease in which the bones lose minerals and strength with aging. This can result in bone fractures. If you are 58 years old or older, or if you are at risk for osteoporosis and fractures, ask your health care provider if you should:  Be screened for bone loss.  Take a calcium or vitamin D supplement to lower your risk of fractures.  Be given hormone replacement therapy (HRT) to treat symptoms of menopause. Follow these  instructions at home: Lifestyle  Do not use any products that contain nicotine or tobacco, such as cigarettes, e-cigarettes, and chewing tobacco. If you need help quitting, ask your health care provider.  Do not use street drugs.  Do not share needles.  Ask your health care provider for help if you need support or information about quitting drugs. Alcohol use  Do not drink alcohol if: ? Your health care provider tells you not to drink. ? You are pregnant, may be pregnant, or are planning to become pregnant.  If you drink alcohol: ? Limit how much you use to 0-1 drink a day. ? Limit intake if you are breastfeeding.  Be aware of how much alcohol is in your drink. In the [place] one drink equals one 12 oz bottle of beer (355 mL), one 5 oz glass of wine (148 mL), or one 1 oz glass of hard liquor (44 mL). General instructions  Schedule regular health, dental, and eye exams.  Stay current with your vaccines.  Tell your health care provider if: ? You often feel depressed. ? You have ever been abused or do not feel safe at home. Summary  Adopting a healthy lifestyle and getting preventive care are important in promoting health and wellness.  Follow your health care provider's instructions about healthy diet, exercising, and getting tested or screened for diseases.  Follow your health care provider's instructions on monitoring your cholesterol and blood pressure. This information is not intended to replace advice given to you by your health care provider. Make sure you discuss any questions you have with your health care provider. Document Revised: 11/21/2018 Document Reviewed: 11/21/2018 Elsevier Patient Education  [place]. Escitalopram tablets What is this medicine? ESCITALOPRAM (es sye TAL oh pram) is used to treat depression and certain types of anxiety. This medicine may be used for other purposes; ask your health care provider or pharmacist if you have  questions. COMMON BRAND NAME(S): Lexapro What should I tell my health care provider before I take this medicine? They need to know if you have any of these conditions:  bipolar disorder or a family history of bipolar disorder  diabetes  glaucoma  heart disease  kidney or liver disease  receiving electroconvulsive therapy  seizures (convulsions)  suicidal thoughts, plans, or attempt by you or a family member  an unusual or allergic reaction to escitalopram, the related drug citalopram, other medicines, foods, dyes, or preservatives  pregnant or trying to become pregnant  breast-feeding How should I use this medicine? Take this medicine by mouth with a glass of water. Follow the directions on the prescription label. You can take it with or without food. If it upsets your stomach, take it with food. Take your medicine at regular intervals. Do not take it more often than directed. Do not stop taking this medicine suddenly except upon the advice of your doctor. Stopping this medicine too quickly may cause serious side effects or your condition may worsen. A special MedGuide will be given to you by the pharmacist with each prescription and refill. Be sure to read this information carefully each time. Talk to your pediatrician regarding the use of this medicine in children. Special care may be needed. Overdosage: If you think you have taken too much of this medicine contact a poison control center or emergency room at once. NOTE: This medicine is only for you. Do not share this medicine with others. What if I miss a dose? If you miss a dose, take it as soon as you can. If it is almost time for your next dose, take only that dose. Do not take double or extra doses. What may interact with this medicine? Do not take this medicine with any of the following medications:  certain medicines for fungal infections like fluconazole, itraconazole, ketoconazole, posaconazole,  voriconazole  cisapride  citalopram  dronedarone  linezolid  MAOIs like Carbex, Eldepryl, Marplan, Nardil, and Parnate  methylene blue (injected into a vein)  pimozide  thioridazine This medicine may also interact with the following medications:  alcohol  amphetamines  aspirin and aspirin-like medicines  carbamazepine  certain medicines for depression, anxiety, or psychotic disturbances  certain medicines for migraine headache like almotriptan, eletriptan, frovatriptan, naratriptan, rizatriptan, sumatriptan, zolmitriptan  certain medicines for sleep  certain medicines that treat or prevent blood clots like warfarin, enoxaparin, dalteparin  cimetidine  diuretics  dofetilide  fentanyl  furazolidone  isoniazid  lithium  metoprolol  NSAIDs, medicines for pain and inflammation, like ibuprofen or naproxen  other medicines that prolong the QT interval (cause an abnormal heart rhythm)  procarbazine  rasagiline  supplements like St. John's wort, kava kava, valerian  tramadol  tryptophan  ziprasidone This list may not describe all possible interactions. Give your health care provider a list of all the medicines, herbs, non-prescription drugs, or dietary supplements you use. Also tell them if you smoke, drink alcohol, or use illegal drugs. Some items may interact with your medicine. What should I watch for while using this medicine? Tell your doctor if your symptoms do not get better or if they get worse. Visit your doctor or health care professional for regular checks on your progress. Because it may take several weeks to see the full effects of this medicine, it is important to continue your treatment as prescribed by your doctor. Patients and their families should watch out for new or worsening thoughts of suicide or depression. Also watch out for sudden changes in feelings such as feeling anxious, agitated, panicky, irritable, hostile, aggressive,  impulsive, severely restless, overly excited and hyperactive, or not being able to sleep. If this happens, especially at the beginning of treatment or after a change in dose, call your health care professional. Dennis Bast may get drowsy or dizzy. Do not drive, use machinery, or do anything that needs mental alertness until you know how this medicine affects you. Do not stand or sit up quickly, especially if you are an older patient. This reduces the risk of dizzy or fainting spells. Alcohol may interfere with the effect of this medicine. Avoid alcoholic drinks. Your mouth may get dry. Chewing sugarless gum or sucking hard candy, and drinking plenty of water may help. Contact your doctor if the problem does not go away or is severe. What side effects may I notice from receiving this medicine? Side effects that you should report to your doctor or health care professional as soon as possible:  allergic reactions like skin rash, itching or hives, swelling of the face, lips, or tongue  anxious  black, tarry stools  changes in vision  confusion  elevated mood, decreased need for sleep, racing thoughts, impulsive behavior  eye pain  fast, irregular heartbeat  feeling faint or lightheaded, falls  feeling agitated, angry, or irritable  hallucination, loss of contact with reality  loss of balance or coordination  loss of memory  painful or prolonged erections  restlessness, pacing, inability to keep still  seizures  stiff muscles  suicidal thoughts or other mood changes  trouble sleeping  unusual bleeding or bruising  unusually weak or tired  vomiting Side effects that usually do not require medical attention (report to your doctor or health care professional if they continue or are bothersome):  changes in appetite  change in sex drive or performance  headache  increased sweating  indigestion, nausea  tremors This list may not describe  all possible side effects. Call  your doctor for medical advice about side effects. You may report side effects to FDA at 1-800-FDA-1088. Where should I keep my medicine? Keep out of reach of children. Store at room temperature between 15 and 30 degrees C (59 and 86 degrees F). Throw away any unused medicine after the expiration date. NOTE: This sheet is a summary. It may not cover all possible information. If you have questions about this medicine, talk to your doctor, pharmacist, or health care provider.  2020 Elsevier/Gold Standard (2018-11-19 11:21:44)

## 2020-11-13 NOTE — Progress Notes (Signed)
Urine ok

## 2020-11-13 NOTE — Progress Notes (Signed)
Knee x ray within normal limits. Try Ibuprofen 600 mg po BID for 10 days to see if improvement May need follow up office visit if not improving.

## 2020-11-13 NOTE — Progress Notes (Signed)
New patient visit   Patient: Alexa Osborne   DOB: [birth year]   44 y.o. Female  MRN: [id number] Visit Date: 11/13/2020  Today's healthcare provider: Marcille Buffy, FNP   Chief Complaint  Patient presents with  . New Patient (Initial Visit)   Subjective    Alexa Osborne is a 44 y.o. female who presents today as a new patient to establish care.  HPI  Patient comes to our practice from [place] she states that she feels fairly well today. Patient would like to address left knee pain that has been present for 2 months or more that radiates down into the heel of her foot. Patient denies history of arthritis, she states that she has notices swelling around her knee and ankle on the left side. Patient states that she would also like to address symptoms of anxiety and depression. Patient states that she has been battling with weight gain, patient reports that she was previously on the keto diet for 18 months and had lost close to 200lbs. Patient states that she can not motivate her self to get active and back on track with her diet which has caused her to have anxiety attacks and feelings of depression.    She is smoking - she smoked since 44 years old 1ppd.   Denies any alcohol.  She uses occasional marijuana., denies any other drug use.    She is overdue for PAP smear and mammogram.   Patient  denies any fever, body aches,chills, rash, chest pain, shortness of breath, nausea, vomiting, or diarrhea.  Denies dizziness, lightheadedness, pre syncopal or syncopal episodes.   Past Medical History:  Diagnosis Date  . Acid reflux   . Allergy   . Anxiety   . Asthma   . Depression    Past Surgical History:  Procedure Laterality Date  . DILATION AND CURETTAGE OF UTERUS    . TONSILLECTOMY    . TUBAL LIGATION     Family Status  Relation Name Status  . Mother  (Not Specified)  . Father  (Not Specified)   Family History  Problem Relation Age of Onset  . Diabetes  Mother   . Kidney failure Mother   . Hypertension Mother   . Heart failure Mother   . Hypertension Father   . Diabetes Father   . Gout Father    Social History   Socioeconomic History  . Marital status: Single    Spouse name: Not on file  . Number of children: Not on file  . Years of education: Not on file  . Highest education level: Not on file  Occupational History  . Not on file  Tobacco Use  . Smoking status: Current Every Day Smoker    Packs/day: 1.00    Types: Cigarettes  . Smokeless tobacco: Never Used  Substance and Sexual Activity  . Alcohol use: No  . Drug use: No  . Sexual activity: Yes  Other Topics Concern  . Not on file  Social History Narrative  . Not on file   Social Determinants of Health   Financial Resource Strain:   . Difficulty of Paying Living Expenses: Not on file  Food Insecurity:   . Worried About Charity fundraiser in the Last Year: Not on file  . Ran Out of Food in the Last Year: Not on file  Transportation Needs:   . Lack of Transportation (Medical): Not on file  . Lack of Transportation (Non-Medical): Not on file  Physical Activity:   . Days of Exercise per Week: Not on file  . Minutes of Exercise per Session: Not on file  Stress:   . Feeling of Stress : Not on file  Social Connections:   . Frequency of Communication with Friends and Family: Not on file  . Frequency of Social Gatherings with Friends and Family: Not on file  . Attends Religious Services: Not on file  . Active Member of Clubs or Organizations: Not on file  . Attends Archivist Meetings: Not on file  . Marital Status: Not on file   Outpatient Medications Prior to Visit  Medication Sig  . fexofenadine-pseudoephedrine (ALLEGRA-D) 60-120 MG 12 hr tablet Take 1 tablet by mouth 2 (two) times daily.  Marland Kitchen lidocaine (XYLOCAINE) 2 % jelly 1 application by Other route 3 (three) times daily.  . Nitroglycerin (RECTIV) 0.4 % OINT Place 1 application rectally 2 (two)  times daily for 7 days.  . [DISCONTINUED] albuterol (PROVENTIL HFA;VENTOLIN HFA) 108 (90 Base) MCG/ACT inhaler Inhale 2 puffs into the lungs every 6 (six) hours as needed for wheezing or shortness of breath.  . [DISCONTINUED] naproxen (NAPROSYN) 500 MG tablet Take 1 tablet (500 mg total) by mouth 2 (two) times daily with a meal.  . [DISCONTINUED] pantoprazole (PROTONIX) 40 MG tablet Take 1 tablet (40 mg total) by mouth daily.  . [DISCONTINUED] predniSONE (STERAPRED UNI-PAK 21 TAB) 10 MG (21) TBPK tablet Take 6 tablets on day 1, take 5 tablets on day 2, take 4 tablets on day 3, take 3 tablets on day 4, take 2 tablets on day 5, take 1 tablet on day 6  . [DISCONTINUED] sulfamethoxazole-trimethoprim (BACTRIM DS,SEPTRA DS) 800-160 MG tablet Take 1 tablet by mouth 2 (two) times daily.   No facility-administered medications prior to visit.   No Known Allergies  Immunization History  Administered Date(s) Administered  . Moderna SARS-COVID-2 Vaccination 01/17/2020, 02/14/2020, 10/20/2020    Health Maintenance  Topic Date Due  . Hepatitis C Screening  Never done  . HIV Screening  Never done  . TETANUS/TDAP  Never done  . PAP SMEAR-Modifier  Never done  . INFLUENZA VACCINE  Never done  . COVID-19 Vaccine  Completed    Patient Care Team: Estle Sabella, Kelby Aline, FNP as PCP - General (Family Medicine)  Review of Systems  Constitutional: Positive for activity change and appetite change.  HENT: Positive for congestion.   Respiratory: Positive for cough, shortness of breath and wheezing.   Gastrointestinal: Positive for anal bleeding, constipation and nausea.  Endocrine: Positive for polyuria.  Musculoskeletal: Positive for arthralgias and joint swelling.  Allergic/Immunologic: Positive for environmental allergies.  Neurological:       Patient reports twitching of left side of face  Psychiatric/Behavioral: Positive for agitation. The patient is nervous/anxious.       Objective    BP 121/82    Pulse 85   Temp 98 F (36.7 C) (Oral)   Resp 16   Ht 5\' 3"  (1.6 m)   Wt (!) 333 lb 3.2 oz (151.1 kg)   LMP 11/04/2020 (Exact Date)   SpO2 99%   BMI 59.02 kg/m  Physical Exam Vitals and nursing note reviewed.  Constitutional:      General: She is not in acute distress.    Appearance: Normal appearance. She is well-developed. She is obese. She is not ill-appearing, toxic-appearing or diaphoretic.     Interventions: She is not intubated.    Comments: Patient is alert and oriented and responsive  to questions Engages in eye contact with provider. Speaks in full sentences without any pauses without any shortness of breath or distress.    HENT:     Head: Normocephalic and atraumatic.     Right Ear: Tympanic membrane, ear canal and external ear normal. There is no impacted cerumen.     Left Ear: Ear canal and external ear normal. There is no impacted cerumen.     Nose: No congestion or rhinorrhea.     Mouth/Throat:     Pharynx: No oropharyngeal exudate or posterior oropharyngeal erythema.  Eyes:     General: Lids are normal. No scleral icterus.       Right eye: No discharge.        Left eye: No discharge.     Conjunctiva/sclera: Conjunctivae normal.     Right eye: Right conjunctiva is not injected. No exudate or hemorrhage.    Left eye: Left conjunctiva is not injected. No exudate or hemorrhage.    Pupils: Pupils are equal, round, and reactive to light.  Neck:     Thyroid: No thyroid mass or thyromegaly.     Vascular: Normal carotid pulses. No carotid bruit, hepatojugular reflux or JVD.     Trachea: Trachea and phonation normal. No tracheal tenderness or tracheal deviation.     Meningeal: Brudzinski's sign and Kernig's sign absent.  Cardiovascular:     Rate and Rhythm: Normal rate and regular rhythm.     Pulses: Normal pulses.          Radial pulses are 2+ on the right side and 2+ on the left side.       Dorsalis pedis pulses are 2+ on the right side and 2+ on the left side.        Posterior tibial pulses are 2+ on the right side and 2+ on the left side.     Heart sounds: Normal heart sounds, S1 normal and S2 normal. Heart sounds not distant. No murmur heard.  No friction rub. No gallop.   Pulmonary:     Effort: Pulmonary effort is normal. No tachypnea, bradypnea, accessory muscle usage or respiratory distress. She is not intubated.     Breath sounds: Normal breath sounds. No stridor. No wheezing or rales.  Chest:     Chest wall: No tenderness.  Abdominal:     General: Bowel sounds are normal. There is no distension or abdominal bruit.     Palpations: Abdomen is soft. There is no shifting dullness, fluid wave, hepatomegaly, splenomegaly, mass or pulsatile mass.     Tenderness: There is no abdominal tenderness. There is no guarding or rebound.     Hernia: No hernia is present.  Musculoskeletal:        General: No tenderness or deformity. Normal range of motion.     Cervical back: Full passive range of motion without pain, normal range of motion and neck supple. No edema, erythema or rigidity. No spinous process tenderness or muscular tenderness. Normal range of motion.  Lymphadenopathy:     Head:     Right side of head: No submental, submandibular, tonsillar, preauricular, posterior auricular or occipital adenopathy.     Left side of head: No submental, submandibular, tonsillar, preauricular, posterior auricular or occipital adenopathy.     Cervical: No cervical adenopathy.     Right cervical: No superficial, deep or posterior cervical adenopathy.    Left cervical: No superficial, deep or posterior cervical adenopathy.     Upper Body:     Right  upper body: No supraclavicular or pectoral adenopathy.     Left upper body: No supraclavicular or pectoral adenopathy.  Skin:    General: Skin is warm and dry.     Capillary Refill: Capillary refill takes less than 2 seconds.     Coloration: Skin is not pale.     Findings: No abrasion, bruising, burn, ecchymosis, erythema,  lesion, petechiae or rash.     Nails: There is no clubbing.  Neurological:     General: No focal deficit present.     Mental Status: She is alert and oriented to person, place, and time.     GCS: GCS eye subscore is 4. GCS verbal subscore is 5. GCS motor subscore is 6.     Cranial Nerves: No cranial nerve deficit.     Sensory: No sensory deficit.     Motor: No weakness, tremor, atrophy, abnormal muscle tone or seizure activity.     Coordination: Coordination normal.     Gait: Gait normal.     Deep Tendon Reflexes: Reflexes are normal and symmetric. Reflexes normal. Babinski sign absent on the right side. Babinski sign absent on the left side.     Reflex Scores:      Tricep reflexes are 2+ on the right side and 2+ on the left side.      Bicep reflexes are 2+ on the right side and 2+ on the left side.      Brachioradialis reflexes are 2+ on the right side and 2+ on the left side.      Patellar reflexes are 2+ on the right side and 2+ on the left side.      Achilles reflexes are 2+ on the right side and 2+ on the left side. Psychiatric:        Speech: Speech normal.        Behavior: Behavior normal.        Thought Content: Thought content normal.        Judgment: Judgment normal.      Depression Screen PHQ 2/9 Scores 11/13/2020  PHQ - 2 Score 6  PHQ- 9 Score 22   Results for orders placed or performed in visit on 11/13/20  CBC with Differential/Platelet  Result Value Ref Range   WBC 8.2 3.4 - 10.8 x10E3/uL   RBC 4.92 3.77 - 5.28 x10E6/uL   Hemoglobin 9.9 (L) 11.1 - 15.9 g/dL   Hematocrit 32.8 (L) 34.0 - 46.6 %   MCV 67 (L) 79 - 97 fL   MCH 20.1 (L) 26.6 - 33.0 pg   MCHC 30.2 (L) 31 - 35 g/dL   RDW 18.5 (H) 11.7 - 15.4 %   Platelets 368 150 - 450 x10E3/uL   Neutrophils 72 Not Estab. %   Lymphs 22 Not Estab. %   Monocytes 5 Not Estab. %   Eos 1 Not Estab. %   Basos 0 Not Estab. %   Neutrophils Absolute 5.9 1.40 - 7.00 x10E3/uL   Lymphocytes Absolute 1.8 0 - 3 x10E3/uL    Monocytes Absolute 0.4 0 - 0 x10E3/uL   EOS (ABSOLUTE) 0.1 0.0 - 0.4 x10E3/uL   Basophils Absolute 0.0 0 - 0 x10E3/uL   Immature Granulocytes 0 Not Estab. %   Immature Grans (Abs) 0.0 0.0 - 0.1 x10E3/uL  Comprehensive metabolic panel  Result Value Ref Range   Glucose 84 65 - 99 mg/dL   BUN 8 6 - 24 mg/dL   Creatinine, Ser 0.69 0.57 - 1.00 mg/dL   GFR  calc non Af Amer 105 >59 mL/min/1.73   GFR calc Af Amer 121 >59 mL/min/1.73   BUN/Creatinine Ratio 12 9 - 23   Sodium 138 134 - 144 mmol/L   Potassium 4.8 3.5 - 5.2 mmol/L   Chloride 105 96 - 106 mmol/L   CO2 20 20 - 29 mmol/L   Calcium 9.4 8.7 - 10.2 mg/dL   Total Protein 7.1 6.0 - 8.5 g/dL   Albumin 4.0 3.8 - 4.8 g/dL   Globulin, Total 3.1 1.5 - 4.5 g/dL   Albumin/Globulin Ratio 1.3 1.2 - 2.2   Bilirubin Total 0.2 0.0 - 1.2 mg/dL   Alkaline Phosphatase 93 44 - 121 IU/L   AST 13 0 - 40 IU/L   ALT 13 0 - 32 IU/L  Hemoglobin A1c  Result Value Ref Range   Hgb A1c MFr Bld 5.6 4.8 - 5.6 %   Est. average glucose Bld gHb Est-mCnc 114 mg/dL  Lipid panel  Result Value Ref Range   Cholesterol, Total 253 (H) 100 - 199 mg/dL   Triglycerides 151 (H) 0 - 149 mg/dL   HDL 60 >39 mg/dL   VLDL Cholesterol Cal 27 5 - 40 mg/dL   LDL Chol Calc (NIH) 166 (H) 0 - 99 mg/dL   Chol/HDL Ratio 4.2 0.0 - 4.4 ratio  TSH  Result Value Ref Range   TSH 0.873 0.450 - 4.500 uIU/mL  VITAMIN D 25 Hydroxy (Vit-D Deficiency, Fractures)  Result Value Ref Range   Vit D, 25-Hydroxy 8.5 (L) 30.0 - 100.0 ng/mL  POCT urinalysis dipstick  Result Value Ref Range   Color, UA yellow    Clarity, UA clear    Glucose, UA Negative Negative   Bilirubin, UA negative    Ketones, UA negative    Spec Grav, UA <=1.005 (A) 1.010 - 1.025   Blood, UA negative    pH, UA 7.5 5.0 - 8.0   Protein, UA Negative Negative   Urobilinogen, UA 0.2 0.2 or 1.0 E.U./dL   Nitrite, UA negative    Leukocytes, UA Negative Negative   Appearance     Odor      Assessment & Plan        Screening for blood or protein in urine - Plan: POCT urinalysis dipstick  Vitamin D insufficiency - Plan: VITAMIN D 25 Hydroxy (Vit-D Deficiency, Fractures)  Left knee pain, unspecified chronicity - Plan: CBC with Differential/Platelet, Comprehensive metabolic panel, Hemoglobin A1c, Lipid panel, TSH, DG Knee Complete 4 Views Left  Gastroesophageal reflux disease without esophagitis  Mild intermittent asthma without complication  Screening mammogram for breast cancer - Plan: MM Digital Screening  Chronic pain of left ankle  Acute left ankle pain - Plan: DG Ankle Complete Left    Orders Placed This Encounter  Procedures  . DG Knee Complete 4 Views Left    Order Specific Question:   Reason for Exam (SYMPTOM  OR DIAGNOSIS REQUIRED)    Answer:   knee pain left knee, swelling at times.    Order Specific Question:   Is patient pregnant?    Answer:   No    Order Specific Question:   Preferred imaging location?    Answer:   [facility]  . MM Digital Screening    Order Specific Question:   Reason for Exam (SYMPTOM  OR DIAGNOSIS REQUIRED)    Answer:   screening mamogram.    Order Specific Question:   Is the patient pregnant?    Answer:   No  Order Specific Question:   Preferred imaging location?    Answer:   [facility]  . DG Ankle Complete Left    Order Specific Question:   Reason for Exam (SYMPTOM  OR DIAGNOSIS REQUIRED)    Answer:   left ankle pain and swelling x 2 months no injury known,    Order Specific Question:   Is patient pregnant?    Answer:   No    Order Specific Question:   Preferred imaging location?    Answer:   [facility]  . CBC with Differential/Platelet  . Comprehensive metabolic panel  . Hemoglobin A1c  . Lipid panel  . TSH  . VITAMIN D 25 Hydroxy (Vit-D Deficiency, Fractures)  . POCT urinalysis dipstick     Meds ordered this encounter  Medications  . pantoprazole (PROTONIX) 40 MG tablet    Sig: Take 1 tablet (40 mg total) by  mouth daily.    Dispense:  90 tablet    Refill:  1  . albuterol (VENTOLIN HFA) 108 (90 Base) MCG/ACT inhaler    Sig: Inhale 1-2 puffs into the lungs every 6 (six) hours as needed for wheezing or shortness of breath.    Dispense:  18 g    Refill:  1  . escitalopram (LEXAPRO) 10 MG tablet    Sig: Take 1 tablet (10 mg total) by mouth daily.    Dispense:  60 tablet    Refill:  0  . amoxicillin-clavulanate (AUGMENTIN) 875-125 MG tablet    Sig: Take 1 tablet by mouth 2 (two) times daily.    Dispense:  20 tablet    Refill:  0  . neomycin-polymyxin-hydrocortisone (CORTISPORIN) 3.5-10000-1 OTIC suspension    Sig: Place 3 drops into the right ear 4 (four) times daily.    Dispense:  10 mL    Refill:  0  . fluconazole (DIFLUCAN) 150 MG tablet    Sig: Take 1 tablet (150 mg total) by mouth as directed. Take one tablet by mouth on day 1. May repeat dose of one tablet by mouth on day four.    Dispense:  2 tablet    Refill:  0   Diflucan requested for antibiotic induced yeast history if she needs she can take.  Needs CPE   Return if symptoms worsen or fail to improve, for at any time for any worsening symptoms, Go to Emergency room/ urgent care if worse.     Red Flags discussed. The patient was given clear instructions to go to ER or return to medical center if any red flags develop, symptoms do not improve, worsen or new problems develop. They verbalized understanding.   Marcille Buffy, [place] [phone] (phone) [phone] (fax)  [place]

## 2020-11-13 NOTE — Progress Notes (Signed)
Negative left ankle x-ray

## 2020-11-14 LAB — CBC WITH DIFFERENTIAL/PLATELET
Basophils Absolute: 0 10*3/uL (ref 0.0–0.2)
Basos: 0 %
EOS (ABSOLUTE): 0.1 10*3/uL (ref 0.0–0.4)
Eos: 1 %
Hematocrit: 32.8 % — ABNORMAL LOW (ref 34.0–46.6)
Hemoglobin: 9.9 g/dL — ABNORMAL LOW (ref 11.1–15.9)
Immature Grans (Abs): 0 10*3/uL (ref 0.0–0.1)
Immature Granulocytes: 0 %
Lymphocytes Absolute: 1.8 10*3/uL (ref 0.7–3.1)
Lymphs: 22 %
MCH: 20.1 pg — ABNORMAL LOW (ref 26.6–33.0)
MCHC: 30.2 g/dL — ABNORMAL LOW (ref 31.5–35.7)
MCV: 67 fL — ABNORMAL LOW (ref 79–97)
Monocytes Absolute: 0.4 10*3/uL (ref 0.1–0.9)
Monocytes: 5 %
Neutrophils Absolute: 5.9 10*3/uL (ref 1.4–7.0)
Neutrophils: 72 %
Platelets: 368 10*3/uL (ref 150–450)
RBC: 4.92 x10E6/uL (ref 3.77–5.28)
RDW: 18.5 % — ABNORMAL HIGH (ref 11.7–15.4)
WBC: 8.2 10*3/uL (ref 3.4–10.8)

## 2020-11-14 LAB — COMPREHENSIVE METABOLIC PANEL
ALT: 13 IU/L (ref 0–32)
AST: 13 IU/L (ref 0–40)
Albumin/Globulin Ratio: 1.3 (ref 1.2–2.2)
Albumin: 4 g/dL (ref 3.8–4.8)
Alkaline Phosphatase: 93 IU/L (ref 44–121)
BUN/Creatinine Ratio: 12 (ref 9–23)
BUN: 8 mg/dL (ref 6–24)
Bilirubin Total: 0.2 mg/dL (ref 0.0–1.2)
CO2: 20 mmol/L (ref 20–29)
Calcium: 9.4 mg/dL (ref 8.7–10.2)
Chloride: 105 mmol/L (ref 96–106)
Creatinine, Ser: 0.69 mg/dL (ref 0.57–1.00)
GFR calc Af Amer: 121 mL/min/{1.73_m2} (ref >59)
GFR calc non Af Amer: 105 mL/min/{1.73_m2} (ref >59)
Globulin, Total: 3.1 g/dL (ref 1.5–4.5)
Glucose: 84 mg/dL (ref 65–99)
Potassium: 4.8 mmol/L (ref 3.5–5.2)
Sodium: 138 mmol/L (ref 134–144)
Total Protein: 7.1 g/dL (ref 6.0–8.5)

## 2020-11-14 LAB — LIPID PANEL
Chol/HDL Ratio: 4.2 ratio (ref 0.0–4.4)
Cholesterol, Total: 253 mg/dL — ABNORMAL HIGH (ref 100–199)
HDL: 60 mg/dL (ref >39)
LDL Chol Calc (NIH): 166 mg/dL — ABNORMAL HIGH (ref 0–99)
Triglycerides: 151 mg/dL — ABNORMAL HIGH (ref 0–149)
VLDL Cholesterol Cal: 27 mg/dL (ref 5–40)

## 2020-11-14 LAB — VITAMIN D 25 HYDROXY (VIT D DEFICIENCY, FRACTURES): Vit D, 25-Hydroxy: 8.5 ng/mL — ABNORMAL LOW (ref 30.0–100.0)

## 2020-11-14 LAB — HEMOGLOBIN A1C
Est. average glucose Bld gHb Est-mCnc: 114 mg/dL
Hgb A1c MFr Bld: 5.6 % (ref 4.8–5.6)

## 2020-11-14 LAB — TSH: TSH: 0.873 u[IU]/mL (ref 0.450–4.500)

## 2020-11-15 ENCOUNTER — Other Ambulatory Visit: Payer: Self-pay | Admitting: Adult Health

## 2020-11-15 DIAGNOSIS — E559 Vitamin D deficiency, unspecified: Secondary | ICD-10-CM

## 2020-11-15 DIAGNOSIS — M25572 Pain in left ankle and joints of left foot: Secondary | ICD-10-CM | POA: Insufficient documentation

## 2020-11-15 DIAGNOSIS — G8929 Other chronic pain: Secondary | ICD-10-CM | POA: Insufficient documentation

## 2020-11-15 MED ORDER — VITAMIN D (ERGOCALCIFEROL) 1.25 MG (50000 UNIT) PO CAPS: 50000.0000 [IU] | ORAL_CAPSULE | ORAL | 0 refills | Status: DC

## 2020-11-15 NOTE — Progress Notes (Unsigned)
Meds ordered this encounter  Medications  . Vitamin D, Ergocalciferol, (DRISDOL) 1.25 MG (50000 UNIT) CAPS capsule    Sig: Take 1 capsule (50,000 Units total) by mouth every 7 (seven) days. (taking one tablet per week) walk in lab in office 1-2 weeks after completing prescription.    Dispense:  12 capsule    Refill:  0    I have already ordered this as a future order in the computer.  Orders Placed This Encounter  Procedures  . VITAMIN D 25 Hydroxy (Vit-D Deficiency, Fractures)    Standing Status:   Future    Standing Expiration Date:   03/16/2021

## 2020-11-15 NOTE — Progress Notes (Signed)
Patient is anemic, verify no heavy vaginal bleeding  or amy rectal bleeding. Will you please add on a Iron TIBC ferritin lab for diagnosis of anemia if [facility] is still able to do this. Will wait for this and then start iron prescription likely if needed. Will result once returned.  CMP within normal limits for kidney, liver function and electrolytes.  Hemoglobin A1C ok non diabetic range.  Total cholesterol, triglycerides, and LDL elevated.  Discuss lifestyle modification with patient e.g. increase exercise, fiber, fruits, vegetables, lean meat, and omega 3/fish intake and decrease saturated fat.  If patient following strict diet and exercise program already please schedule follow up appointment with primary care physician.  TSH within normal limits.  Vitamin D is very low, insufficient, and recommend Vitamin D perscr iption at 50,000 units by mouth every 7 days (once weekly ) for 12 weeks and recheck vitamin d level one to two weeks after completion of taking. Will likely need more prescription - but will have to have lab prior to further prescription.  Will send vitamin d and place walk in lab order for when needed.  Meds ordered this encounter Medications  Vitamin D, Ergocalciferol, (DRISDOL) 1.25 MG (50000 UNIT) CAPS capsule   Sig: Take 1 capsule (50,000 Units total) by mouth every 7 (seven) days. (taking one tablet per week) walk in lab in office 1-2 weeks after completing prescription.   Dispense:  12 capsule   Refill:  0

## 2020-12-18 ENCOUNTER — Encounter: Payer: Self-pay | Admitting: Adult Health

## 2020-12-18 ENCOUNTER — Ambulatory Visit (INDEPENDENT_AMBULATORY_CARE_PROVIDER_SITE_OTHER): Payer: 59 | Admitting: Adult Health

## 2020-12-18 ENCOUNTER — Other Ambulatory Visit: Payer: Self-pay

## 2020-12-18 ENCOUNTER — Other Ambulatory Visit (HOSPITAL_COMMUNITY)
Admission: RE | Admit: 2020-12-18 | Discharge: 2020-12-18 | Disposition: A | Discharge status: Home / Self Care | Payer: 59 | Source: Ambulatory Visit | Attending: Adult Health | Admitting: Adult Health

## 2020-12-18 VITALS — BP 120/71 | HR 89 | Temp 97.9°F | Resp 16 | Wt 325.4 lb

## 2020-12-18 DIAGNOSIS — Z124 Encounter for screening for malignant neoplasm of cervix: Secondary | ICD-10-CM

## 2020-12-18 DIAGNOSIS — D509 Iron deficiency anemia, unspecified: Secondary | ICD-10-CM | POA: Diagnosis not present

## 2020-12-18 DIAGNOSIS — R635 Abnormal weight gain: Secondary | ICD-10-CM | POA: Diagnosis not present

## 2020-12-18 DIAGNOSIS — Z1211 Encounter for screening for malignant neoplasm of colon: Secondary | ICD-10-CM | POA: Diagnosis not present

## 2020-12-18 MED ORDER — ESCITALOPRAM OXALATE 10 MG PO TABS: 10.0000 mg | ORAL_TABLET | Freq: Every day | ORAL | 1 refills | Status: DC

## 2020-12-18 MED ORDER — FERROUS SULFATE 325 (65 FE) MG PO TABS: 325.0000 mg | ORAL_TABLET | Freq: Every day | ORAL | 1 refills | Status: DC

## 2020-12-18 MED ORDER — WEGOVY 0.25 MG/0.5ML ~~LOC~~ SOAJ: 0.2500 mg | SUBCUTANEOUS | 0 refills | Status: DC

## 2020-12-18 NOTE — Progress Notes (Signed)
Established patient visit   Patient: Alexa Osborne   DOB: 1976/01/27   45 y.o. Female  MRN: [id number] Visit Date: 12/18/2020  Today's healthcare provider: Marcille Buffy, FNP   Chief Complaint  Patient presents with  . Anxiety  . Gynecologic Exam   Subjective    HPI  Anxiety, Follow-up  She was last seen for anxiety 1 months ago. Changes made at last visit include started patient on Lexapro 10mg .   She reports good compliance with treatment. She reports good tolerance of treatment. She is having side effects. Patient notices that appetite has been decreased   She does have heavy bleeding with menses.   She feels her anxiety is mild and Improved since last visit.  Symptoms: No chest pain No difficulty concentrating  No dizziness No fatigue  No feelings of losing control No insomnia  No irritable No palpitations  No panic attacks Yes racing thoughts  No shortness of breath Yes sweating  No tremors/shakes    GAD-7 Results GAD-7 Generalized Anxiety Disorder Screening Tool 11/13/2020  1. Feeling Nervous, Anxious, or on Edge 3  2. Not Being Able to Stop or Control Worrying 3  3. Worrying Too Much About Different Things 3  4. Trouble Relaxing 2  5. Being So Restless it's Hard To Sit Still 2  6. Becoming Easily Annoyed or Irritable 3  7. Feeling Afraid As If Something Awful Might Happen 3  Total GAD-7 Score 19  Difficulty At Work, Home, or Getting  Along With Others? Very difficult    PHQ-9 Scores PHQ9 SCORE ONLY 11/13/2020  PHQ-9 Total Score 22    ---------------------------------------------------------------------------------------------------   Patient Active Problem List   Diagnosis Date Noted  . Weight gain 12/27/2020  . Iron deficiency anemia 12/27/2020  . Chronic pain of left ankle 11/15/2020  . Acute left ankle pain 11/15/2020  . No-show for appointment 11/13/2020  . Screening for colon cancer 11/13/2020  . Vitamin D insufficiency  11/13/2020  . Left knee pain 11/13/2020  . Gastroesophageal reflux disease without esophagitis 11/13/2020  . Mild intermittent asthma without complication [id number]   Past Medical History:  Diagnosis Date  . Acid reflux   . Allergy   . Anxiety   . Asthma   . Depression    No Known Allergies     Medications: Outpatient Medications Prior to Visit  Medication Sig  . albuterol (VENTOLIN HFA) 108 (90 Base) MCG/ACT inhaler Inhale 1-2 puffs into the lungs every 6 (six) hours as needed for wheezing or shortness of breath.  . pantoprazole (PROTONIX) 40 MG tablet Take 1 tablet (40 mg total) by mouth daily.  . Vitamin D, Ergocalciferol, (DRISDOL) 1.25 MG (50000 UNIT) CAPS capsule Take 1 capsule (50,000 Units total) by mouth every 7 (seven) days. (taking one tablet per week) walk in lab in office 1-2 weeks after completing prescription.  . [DISCONTINUED] amoxicillin-clavulanate (AUGMENTIN) 875-125 MG tablet Take 1 tablet by mouth 2 (two) times daily.  . [DISCONTINUED] escitalopram (LEXAPRO) 10 MG tablet Take 1 tablet (10 mg total) by mouth daily.  . fexofenadine-pseudoephedrine (ALLEGRA-D) 60-120 MG 12 hr tablet Take 1 tablet by mouth 2 (two) times daily. (Patient not taking: Reported on 12/18/2020)  . fluconazole (DIFLUCAN) 150 MG tablet Take 1 tablet (150 mg total) by mouth as directed. Take one tablet by mouth on day 1. May repeat dose of one tablet by mouth on day four. (Patient not taking: Reported on 12/18/2020)  . lidocaine (XYLOCAINE) 2 %  jelly 1 application by Other route 3 (three) times daily. (Patient not taking: Reported on 12/18/2020)  . neomycin-polymyxin-hydrocortisone (CORTISPORIN) 3.5-10000-1 OTIC suspension Place 3 drops into the right ear 4 (four) times daily. (Patient not taking: Reported on 12/18/2020)  . Nitroglycerin (RECTIV) 0.4 % OINT Place 1 application rectally 2 (two) times daily for 7 days.   No facility-administered medications prior to visit.    Review of Systems   Constitutional: Negative.   Respiratory: Negative.       Objective    BP 120/71   Pulse 89   Temp 97.9 F (36.6 C) (Oral)   Resp 16   Wt (!) 325 lb 6.4 oz (147.6 kg)   LMP 12/03/2020 (Exact Date)   SpO2 98%   BMI 57.64 kg/m    Physical Exam Vitals reviewed.  Constitutional:      General: She is not in acute distress.    Appearance: She is well-developed. She is not diaphoretic.     Interventions: She is not intubated. HENT:     Head: Normocephalic and atraumatic.     Right Ear: External ear normal.     Left Ear: External ear normal.     Nose: Nose normal.     Mouth/Throat:     Pharynx: No oropharyngeal exudate.  Eyes:     General: Lids are normal. No scleral icterus.       Right eye: No discharge.        Left eye: No discharge.     Conjunctiva/sclera: Conjunctivae normal.     Right eye: Right conjunctiva is not injected. No exudate or hemorrhage.    Left eye: Left conjunctiva is not injected. No exudate or hemorrhage.    Pupils: Pupils are equal, round, and reactive to light.  Neck:     Thyroid: No thyroid mass or thyromegaly.     Vascular: Normal carotid pulses. No carotid bruit, hepatojugular reflux or JVD.     Trachea: Trachea and phonation normal. No tracheal tenderness or tracheal deviation.     Meningeal: Brudzinski's sign and Kernig's sign absent.  Cardiovascular:     Rate and Rhythm: Normal rate and regular rhythm.     Pulses: Normal pulses.          Radial pulses are 2+ on the right side and 2+ on the left side.       Dorsalis pedis pulses are 2+ on the right side and 2+ on the left side.       Posterior tibial pulses are 2+ on the right side and 2+ on the left side.     Heart sounds: Normal heart sounds, S1 normal and S2 normal. Heart sounds not distant. No murmur heard. No friction rub. No gallop.   Pulmonary:     Effort: Pulmonary effort is normal. No tachypnea, bradypnea, accessory muscle usage or respiratory distress. She is not intubated.      Breath sounds: Normal breath sounds. No stridor. No wheezing or rales.  Chest:     Chest wall: No tenderness.  Breasts:     Right: No supraclavicular adenopathy.     Left: No supraclavicular adenopathy.    Abdominal:     General: Bowel sounds are normal. There is no distension or abdominal bruit.     Palpations: Abdomen is soft. There is no shifting dullness, fluid wave, hepatomegaly, splenomegaly, mass or pulsatile mass.     Tenderness: There is no abdominal tenderness. There is no guarding or rebound.     Hernia: No  hernia is present.  Musculoskeletal:        General: No swelling, tenderness or deformity. Normal range of motion.     Cervical back: Full passive range of motion without pain, normal range of motion and neck supple. No edema, erythema or rigidity. No spinous process tenderness or muscular tenderness. Normal range of motion.     Right lower leg: No edema.     Left lower leg: No edema.  Lymphadenopathy:     Head:     Right side of head: No submental, submandibular, tonsillar, preauricular, posterior auricular or occipital adenopathy.     Left side of head: No submental, submandibular, tonsillar, preauricular, posterior auricular or occipital adenopathy.     Cervical: No cervical adenopathy.     Right cervical: No superficial, deep or posterior cervical adenopathy.    Left cervical: No superficial, deep or posterior cervical adenopathy.     Upper Body:     Right upper body: No supraclavicular or pectoral adenopathy.     Left upper body: No supraclavicular or pectoral adenopathy.  Skin:    General: Skin is warm and dry.     Coloration: Skin is not pale.     Findings: No abrasion, bruising, burn, ecchymosis, erythema, lesion, petechiae or rash.     Nails: There is no clubbing.  Neurological:     General: No focal deficit present.     Mental Status: She is alert and oriented to person, place, and time.     GCS: GCS eye subscore is 4. GCS verbal subscore is 5. GCS motor  subscore is 6.     Cranial Nerves: No cranial nerve deficit.     Sensory: No sensory deficit.     Motor: No tremor, atrophy, abnormal muscle tone or seizure activity.     Coordination: Coordination normal.     Gait: Gait normal.     Deep Tendon Reflexes: Reflexes are normal and symmetric. Reflexes normal. Babinski sign absent on the right side. Babinski sign absent on the left side.     Reflex Scores:      Tricep reflexes are 2+ on the right side and 2+ on the left side.      Bicep reflexes are 2+ on the right side and 2+ on the left side.      Brachioradialis reflexes are 2+ on the right side and 2+ on the left side.      Patellar reflexes are 2+ on the right side and 2+ on the left side.      Achilles reflexes are 2+ on the right side and 2+ on the left side. Psychiatric:        Speech: Speech normal.        Behavior: Behavior normal.        Thought Content: Thought content normal.        Judgment: Judgment normal.       Results for orders placed or performed in visit on 12/18/20  CBC with Differential/Platelet  Result Value Ref Range   WBC 8.3 3.4 - 10.8 x10E3/uL   RBC 4.95 3.77 - 5.28 x10E6/uL   Hemoglobin 9.9 (L) 11.1 - 15.9 g/dL   Hematocrit 33.1 (L) 34.0 - 46.6 %   MCV 67 (L) 79 - 97 fL   MCH 20.0 (L) 26.6 - 33.0 pg   MCHC 29.9 (L) 31.5 - 35.7 g/dL   RDW 17.5 (H) 11.7 - 15.4 %   Platelets 330 150 - 450 x10E3/uL   Neutrophils 61 Not  Estab. %   Lymphs 32 Not Estab. %   Monocytes 5 Not Estab. %   Eos 1 Not Estab. %   Basos 1 Not Estab. %   Neutrophils Absolute 5.0 1.4 - 7.0 x10E3/uL   Lymphocytes Absolute 2.7 0.7 - 3.1 x10E3/uL   Monocytes Absolute 0.4 0.1 - 0.9 x10E3/uL   EOS (ABSOLUTE) 0.1 0.0 - 0.4 x10E3/uL   Basophils Absolute 0.0 0.0 - 0.2 x10E3/uL   Immature Granulocytes 0 Not Estab. %   Immature Grans (Abs) 0.0 0.0 - 0.1 x10E3/uL  Fe+TIBC+Fer  Result Value Ref Range   Total Iron Binding Capacity 390 250 - 450 ug/dL   UIBC 367 131 - 425 ug/dL   Iron 23 (L)  27 - 159 ug/dL   Iron Saturation 6 (LL) 15 - 55 %   Ferritin 5 (L) 15 - 150 ng/mL  Beta Thalassemia: HBB Family  Result Value Ref Range   B-Thal Routing WILL FOLLOW   Cytology - PAP  Result Value Ref Range   Neisseria Gonorrhea Negative    Chlamydia Negative    Trichomonas Negative    HSV1 Negative    HSV2 Negative    Adequacy      Satisfactory for evaluation; transformation zone component PRESENT.   Diagnosis      - Negative for intraepithelial lesion or malignancy (NILM)   Comment Normal Reference Range Trichomonas - Negative    Comment Normal Reference Ranger Chlamydia - Negative    Comment      Normal Reference Range Neisseria Gonorrhea - Negative   Comment Normal Reference Range HSV - Negative     Assessment & Plan     Screening for cervical cancer - Plan: Cytology - PAP  Iron deficiency anemia, unspecified iron deficiency anemia type - Plan: CBC with Differential/Platelet, Fe+TIBC+Fer, ferrous sulfate (FERROUSUL) 325 (65 FE) MG tablet, escitalopram (LEXAPRO) 10 MG tablet, Beta Thalassemia: HBB Family  Screening for colon cancer - Plan: Ambulatory referral to Gastroenterology  Weight gain   Meds ordered this encounter  Medications  . ferrous sulfate (FERROUSUL) 325 (65 FE) MG tablet    Sig: Take 1 tablet (325 mg total) by mouth daily with breakfast.    Dispense:  90 tablet    Refill:  1  . escitalopram (LEXAPRO) 10 MG tablet    Sig: Take 1 tablet (10 mg total) by mouth daily.    Dispense:  90 tablet    Refill:  1  . Semaglutide-Weight Management (WEGOVY) 0.25 MG/0.5ML SOAJ    Sig: Inject 0.25 mg into the skin once a week.    Dispense:  1 mL    Refill:  0    Return in about 3 months (around 03/18/2021), or if symptoms worsen or fail to improve, for at any time for any worsening symptoms, Go to Emergency room/ urgent care if worse.      The entirety of the information documented in the History of Present Illness, Review of Systems and Physical Exam were  personally obtained by me. Portions of this information were initially documented by the CMA and reviewed by me for thoroughness and accuracy.      Marcille Buffy, [place] [phone] (phone) [phone] (fax)  [place]

## 2020-12-18 NOTE — Patient Instructions (Addendum)
Call to schedule your screening mammogram. Your orders have been placed for your exam.  Let our office know if you have questions, concerns, or any difficulty scheduling.  If normal results then yearly screening mammograms are recommended unless you notice  Changes in your breast then you should schedule a follow up office visit. If abnormal results  Further imaging will be warranted and sooner follow up as determined by the radiologist at the [facility].   [facility] [place], [place] [address]  Main: [phone]      Managing Anxiety, Adult After being diagnosed with an anxiety disorder, you may be relieved to know why you have felt or behaved a certain way. You may also feel overwhelmed about the treatment ahead and what it will mean for your life. With care and support, you can manage this condition and recover from it. How to manage lifestyle changes Managing stress and anxiety  Stress is your body's reaction to life changes and events, both good and bad. Most stress will last just a few hours, but stress can be ongoing and can lead to more than just stress. Although stress can play a major role in anxiety, it is not the same as anxiety. Stress is usually caused by something external, such as a deadline, test, or competition. Stress normally passes after the triggering event has ended.  Anxiety is caused by something internal, such as imagining a terrible outcome or worrying that something will go wrong that will devastate you. Anxiety often does not go away even after the triggering event is over, and it can become long-term (chronic) worry. It is important to understand the differences between stress and anxiety and to manage your stress effectively so that it does not lead to an anxious response. Talk with your health care provider or a counselor to learn more about reducing anxiety and stress. He or she may suggest tension reduction  techniques, such as:  Music therapy. This can include creating or listening to music that you enjoy and that inspires you.  Mindfulness-based meditation. This involves being aware of your normal breaths while not trying to control your breathing. It can be done while sitting or walking.  Centering prayer. This involves focusing on a word, phrase, or sacred image that means something to you and brings you peace.  Deep breathing. To do this, expand your stomach and inhale slowly through your nose. Hold your breath for 3-5 seconds. Then exhale slowly, letting your stomach muscles relax.  Self-talk. This involves identifying thought patterns that lead to anxiety reactions and changing those patterns.  Muscle relaxation. This involves tensing muscles and then relaxing them. Choose a tension reduction technique that suits your lifestyle and personality. These techniques take time and practice. Set aside 5-15 minutes a day to do them. Therapists can offer counseling and training in these techniques. The training to help with anxiety may be covered by some insurance plans. Other things you can do to manage stress and anxiety include:  Keeping a stress/anxiety diary. This can help you learn what triggers your reaction and then learn ways to manage your response.  Thinking about how you react to certain situations. You may not be able to control everything, but you can control your response.  Making time for activities that help you relax and not feeling guilty about spending your time in this way.  Visual imagery and yoga can help you stay calm and relax.  Medicines Medicines  can help ease symptoms. Medicines for anxiety include:  Anti-anxiety drugs.  Antidepressants. Medicines are often used as a primary treatment for anxiety disorder. Medicines will be prescribed by a health care provider. When used together, medicines, psychotherapy, and tension reduction techniques may be the most effective  treatment. Relationships Relationships can play a big part in helping you recover. Try to spend more time connecting with trusted friends and family members. Consider going to couples counseling, taking family education classes, or going to family therapy. Therapy can help you and others better understand your condition. How to recognize changes in your anxiety Everyone responds differently to treatment for anxiety. Recovery from anxiety happens when symptoms decrease and stop interfering with your daily activities at home or work. This may mean that you will start to:  Have better concentration and focus. Worry will interfere less in your daily thinking.  Sleep better.  Be less irritable.  Have more energy.  Have improved memory. It is important to recognize when your condition is getting worse. Contact your health care provider if your symptoms interfere with home or work and you feel like your condition is not improving. Follow these instructions at home: Activity  Exercise. Most adults should do the following: ? Exercise for at least 150 minutes each week. The exercise should increase your heart rate and make you sweat (moderate-intensity exercise). ? Strengthening exercises at least twice a week.  Get the right amount and quality of sleep. Most adults need 7-9 hours of sleep each night. Lifestyle   Eat a healthy diet that includes plenty of vegetables, fruits, whole grains, low-fat dairy products, and lean protein. Do not eat a lot of foods that are high in solid fats, added sugars, or salt.  Make choices that simplify your life.  Do not use any products that contain nicotine or tobacco, such as cigarettes, e-cigarettes, and chewing tobacco. If you need help quitting, ask your health care provider.  Avoid caffeine, alcohol, and certain over-the-counter cold medicines. These may make you feel worse. Ask your pharmacist which medicines to avoid. General instructions  Take  over-the-counter and prescription medicines only as told by your health care provider.  Keep all follow-up visits as told by your health care provider. This is important. Where to find support You can get help and support from these sources:  Self-help groups.  Online and [employer].  A trusted spiritual leader.  Couples counseling.  Family education classes.  Family therapy. Where to find more information You may find that joining a support group helps you deal with your anxiety. The following sources can help you locate counselors or support groups near you:  [place]: www.mentalhealthamerica.net  Anxiety and Depression Association of [place] (ADAA): https://www.clark.net/  National Alliance on Mental Illness (NAMI): www.nami.org Contact a health care provider if you:  Have a hard time staying focused or finishing daily tasks.  Spend many hours a day feeling worried about everyday life.  Become exhausted by worry.  Start to have headaches, feel tense, or have nausea.  Urinate more than normal.  Have diarrhea. Get help right away if you have:  A racing heart and shortness of breath.  Thoughts of hurting yourself or others. If you ever feel like you may hurt yourself or others, or have thoughts about taking your own life, get help right away. You can go to your nearest emergency department or call:  Your local emergency services (911 in the [place]  A suicide crisis helpline, such as  the National Suicide Prevention Lifeline at 272-413-2481. This is open 24 hours a day. Summary  Taking steps to learn and use tension reduction techniques can help calm you and help prevent triggering an anxiety reaction.  When used together, medicines, psychotherapy, and tension reduction techniques may be the most effective treatment.  Family, friends, and partners can play a big part in helping you recover from an anxiety disorder. This information is not  intended to replace advice given to you by your health care provider. Make sure you discuss any questions you have with your health care provider. Document Revised: 04/30/2019 Document Reviewed: 04/30/2019 Elsevier Patient Education  [place].    Pap Test Why am I having this test? A Pap test, also called a Pap smear, is a screening test to check for signs of:  Cancer of the vagina, cervix, and uterus. The cervix is the lower part of the uterus that opens into the vagina.  Infection.  Changes that may be a sign that cancer is developing (precancerous changes). Women need this test on a regular basis. In general, you should have a Pap test every 3 years until you reach menopause or age 68. Women aged 30-60 may choose to have their Pap test done at the same time as an HPV (human papillomavirus) test every 5 years (instead of every 3 years). Your health care provider may recommend having Pap tests more or less often depending on your medical conditions and past Pap test results. What kind of sample is taken?  Your health care provider will collect a sample of cells from the surface of your cervix. This will be done using a small cotton swab, plastic spatula, or brush. This sample is often collected during a pelvic exam, when you are lying on your back on an exam table with feet in footrests (stirrups). In some cases, fluids (secretions) from the cervix or vagina may also be collected. How do I prepare for this test?  Be aware of where you are in your menstrual cycle. If you are menstruating on the day of the test, you may be asked to reschedule.  You may need to reschedule if you have a known vaginal infection on the day of the test.  Follow instructions from your health care provider about: ? Changing or stopping your regular medicines. Some medicines can cause abnormal test results, such as digitalis and tetracycline. ? Avoiding douching or taking a bath the day before or the  day of the test. Tell a health care provider about:  Any allergies you have.  All medicines you are taking, including vitamins, herbs, eye drops, creams, and over-the-counter medicines.  Any blood disorders you have.  Any surgeries you have had.  Any medical conditions you have.  Whether you are pregnant or may be pregnant. How are the results reported? Your test results will be reported as either abnormal or normal. A false-positive result can occur. A false positive is incorrect because it means that a condition is present when it is not. A false-negative result can occur. A false negative is incorrect because it means that a condition is not present when it is. What do the results mean? A normal test result means that you do not have signs of cancer of the vagina, cervix, or uterus. An abnormal result may mean that you have:  Cancer. A Pap test by itself is not enough to diagnose cancer. You will have more tests done in this case.  Precancerous changes in  your vagina, cervix, or uterus.  Inflammation of the cervix.  An STD (sexually transmitted disease).  A fungal infection.  A parasite infection. Talk with your health care provider about what your results mean. Questions to ask your health care provider Ask your health care provider, or the department that is doing the test:  When will my results be ready?  How will I get my results?  What are my treatment options?  What other tests do I need?  What are my next steps? Summary  In general, women should have a Pap test every 3 years until they reach menopause or age 63.  Your health care provider will collect a sample of cells from the surface of your cervix. This will be done using a small cotton swab, plastic spatula, or brush.  In some cases, fluids (secretions) from the cervix or vagina may also be collected. This information is not intended to replace advice given to you by your health care provider. Make  sure you discuss any questions you have with your health care provider. Document Revised: 08/07/2017 Document Reviewed: 08/07/2017 Elsevier Patient Education  2020 [employer].  Pap Test Why am I having this test? A Pap test, also called a Pap smear, is a screening test to check for signs of:  Cancer of the vagina, cervix, and uterus. The cervix is the lower part of the uterus that opens into the vagina.  Infection.  Changes that may be a sign that cancer is developing (precancerous changes). Women need this test on a regular basis. In general, you should have a Pap test every 3 years until you reach menopause or age 61. Women aged 30-60 may choose to have their Pap test done at the same time as an HPV (human papillomavirus) test every 5 years (instead of every 3 years). Your health care provider may recommend having Pap tests more or less often depending on your medical conditions and past Pap test results. What kind of sample is taken?  Your health care provider will collect a sample of cells from the surface of your cervix. This will be done using a small cotton swab, plastic spatula, or brush. This sample is often collected during a pelvic exam, when you are lying on your back on an exam table with feet in footrests (stirrups). In some cases, fluids (secretions) from the cervix or vagina may also be collected. How do I prepare for this test?  Be aware of where you are in your menstrual cycle. If you are menstruating on the day of the test, you may be asked to reschedule.  You may need to reschedule if you have a known vaginal infection on the day of the test.  Follow instructions from your health care provider about: ? Changing or stopping your regular medicines. Some medicines can cause abnormal test results, such as digitalis and tetracycline. ? Avoiding douching or taking a bath the day before or the day of the test. Tell a health care provider about:  Any allergies you  have.  All medicines you are taking, including vitamins, herbs, eye drops, creams, and over-the-counter medicines.  Any blood disorders you have.  Any surgeries you have had.  Any medical conditions you have.  Whether you are pregnant or may be pregnant. How are the results reported? Your test results will be reported as either abnormal or normal. A false-positive result can occur. A false positive is incorrect because it means that a condition is present when it is  not. A false-negative result can occur. A false negative is incorrect because it means that a condition is not present when it is. What do the results mean? A normal test result means that you do not have signs of cancer of the vagina, cervix, or uterus. An abnormal result may mean that you have:  Cancer. A Pap test by itself is not enough to diagnose cancer. You will have more tests done in this case.  Precancerous changes in your vagina, cervix, or uterus.  Inflammation of the cervix.  An STD (sexually transmitted disease).  A fungal infection.  A parasite infection. Talk with your health care provider about what your results mean. Questions to ask your health care provider Ask your health care provider, or the department that is doing the test:  When will my results be ready?  How will I get my results?  What are my treatment options?  What other tests do I need?  What are my next steps? Summary  In general, women should have a Pap test every 3 years until they reach menopause or age 9.  Your health care provider will collect a sample of cells from the surface of your cervix. This will be done using a small cotton swab, plastic spatula, or brush.  In some cases, fluids (secretions) from the cervix or vagina may also be collected. This information is not intended to replace advice given to you by your health care provider. Make sure you discuss any questions you have with your health care  provider. Document Revised: 08/07/2017 Document Reviewed: 08/07/2017 Elsevier Patient Education  [place].  Anemia  Anemia is a condition in which you do not have enough red blood cells or hemoglobin. Hemoglobin is a substance in red blood cells that carries oxygen. When you do not have enough red blood cells or hemoglobin (are anemic), your body cannot get enough oxygen and your organs may not work properly. As a result, you may feel very tired or have other problems. What are the causes? Common causes of anemia include:  Excessive bleeding. Anemia can be caused by excessive bleeding inside or outside the body, including bleeding from the intestine or from periods in women.  Poor nutrition.  Long-lasting (chronic) kidney, thyroid, and liver disease.  Bone marrow disorders.  Cancer and treatments for cancer.  HIV (human immunodeficiency virus) and AIDS (acquired immunodeficiency syndrome).  Treatments for HIV and AIDS.  Spleen problems.  Blood disorders.  Infections, medicines, and autoimmune disorders that destroy red blood cells. What are the signs or symptoms? Symptoms of this condition include:  Minor weakness.  Dizziness.  Headache.  Feeling heartbeats that are irregular or faster than normal (palpitations).  Shortness of breath, especially with exercise.  Paleness.  Cold sensitivity.  Indigestion.  Nausea.  Difficulty sleeping.  Difficulty concentrating. Symptoms may occur suddenly or develop slowly. If your anemia is mild, you may not have symptoms. How is this diagnosed? This condition is diagnosed based on:  Blood tests.  Your medical history.  A physical exam.  Bone marrow biopsy. Your health care provider may also check your stool (feces) for blood and may do additional testing to look for the cause of your bleeding. You may also have other tests, including:  Imaging tests, such as a CT scan or  MRI.  Endoscopy.  Colonoscopy. How is this treated? Treatment for this condition depends on the cause. If you continue to lose a lot of blood, you may need to be treated  at a hospital. Treatment may include:  Taking supplements of iron, vitamin U04, or folic acid.  Taking a hormone medicine (erythropoietin) that can help to stimulate red blood cell growth.  Having a blood transfusion. This may be needed if you lose a lot of blood.  Making changes to your diet.  Having surgery to remove your spleen. Follow these instructions at home:  Take over-the-counter and prescription medicines only as told by your health care provider.  Take supplements only as told by your health care provider.  Follow any diet instructions that you were given.  Keep all follow-up visits as told by your health care provider. This is important. Contact a health care provider if:  You develop new bleeding anywhere in the body. Get help right away if:  You are very weak.  You are short of breath.  You have pain in your abdomen or chest.  You are dizzy or feel faint.  You have trouble concentrating.  You have bloody or black, tarry stools.  You vomit repeatedly or you vomit up blood. Summary  Anemia is a condition in which you do not have enough red blood cells or enough of a substance in your red blood cells that carries oxygen (hemoglobin).  Symptoms may occur suddenly or develop slowly.  If your anemia is mild, you may not have symptoms.  This condition is diagnosed with blood tests as well as a medical history and physical exam. Other tests may be needed.  Treatment for this condition depends on the cause of the anemia. This information is not intended to replace advice given to you by your health care provider. Make sure you discuss any questions you have with your health care provider. Document Revised: 11/10/2017 Document Reviewed: 12/30/2016 Elsevier Patient Education  2020 [place] / Semaglutide injection solution if start follow up in office in one month.  What is this medicine? SEMAGLUTIDE (Sem a GLOO tide) is used to improve blood sugar control in adults with type 2 diabetes. This medicine may be used with other diabetes medicines. This drug may also reduce the risk of heart attack or stroke if you have type 2 diabetes and risk factors for heart disease. This medicine may be used for other purposes; ask your health care provider or pharmacist if you have questions. COMMON BRAND NAME(S): OZEMPIC What should I tell my health care provider before I take this medicine? They need to know if you have any of these conditions:  endocrine tumors (MEN 2) or if someone in your family had these tumors  eye disease, vision problems  history of pancreatitis  kidney disease  stomach problems  thyroid cancer or if someone in your family had thyroid cancer  an unusual or allergic reaction to semaglutide, other medicines, foods, dyes, or preservatives  pregnant or trying to get pregnant  breast-feeding How should I use this medicine? This medicine is for injection under the skin of your upper leg (thigh), stomach area, or upper arm. It is given once every week (every 7 days). You will be taught how to prepare and give this medicine. Use exactly as directed. Take your medicine at regular intervals. Do not take it more often than directed. If you use this medicine with insulin, you should inject this medicine and the insulin separately. Do not mix them together. Do not give the injections right next to each other. Change (rotate) injection sites with each injection. It is important that you put your used needles and  syringes in a special sharps container. Do not put them in a trash can. If you do not have a sharps container, call your pharmacist or healthcare provider to get one. A special MedGuide will be given to you by the pharmacist with each prescription and  refill. Be sure to read this information carefully each time. This drug comes with INSTRUCTIONS FOR USE. Ask your pharmacist for directions on how to use this drug. Read the information carefully. Talk to your pharmacist or health care provider if you have questions. Talk to your pediatrician regarding the use of this medicine in children. Special care may be needed. Overdosage: If you think you have taken too much of this medicine contact a poison control center or emergency room at once. NOTE: This medicine is only for you. Do not share this medicine with others. What if I miss a dose? If you miss a dose, take it as soon as you can within 5 days after the missed dose. Then take your next dose at your regular weekly time. If it has been longer than 5 days after the missed dose, do not take the missed dose. Take the next dose at your regular time. Do not take double or extra doses. If you have questions about a missed dose, contact your health care provider for advice. What may interact with this medicine?  other medicines for diabetes Many medications may cause changes in blood sugar, these include:  alcohol containing beverages  antiviral medicines for HIV or AIDS  aspirin and aspirin-like drugs  certain medicines for blood pressure, heart disease, irregular heart beat  chromium  diuretics  female hormones, such as estrogens or progestins, birth control pills  fenofibrate  gemfibrozil  isoniazid  lanreotide  female hormones or anabolic steroids  MAOIs like Carbex, Eldepryl, Marplan, Nardil, and Parnate  medicines for weight loss  medicines for allergies, asthma, cold, or cough  medicines for depression, anxiety, or psychotic disturbances  niacin  nicotine  NSAIDs, medicines for pain and inflammation, like ibuprofen or naproxen  octreotide  pasireotide  pentamidine  phenytoin  probenecid  quinolone antibiotics such as ciprofloxacin, levofloxacin,  ofloxacin  some herbal dietary supplements  steroid medicines such as prednisone or cortisone  sulfamethoxazole; trimethoprim  thyroid hormones Some medications can hide the warning symptoms of low blood sugar (hypoglycemia). You may need to monitor your blood sugar more closely if you are taking one of these medications. These include:  beta-blockers, often used for high blood pressure or heart problems (examples include atenolol, metoprolol, propranolol)  clonidine  guanethidine  reserpine This list may not describe all possible interactions. Give your health care provider a list of all the medicines, herbs, non-prescription drugs, or dietary supplements you use. Also tell them if you smoke, drink alcohol, or use illegal drugs. Some items may interact with your medicine. What should I watch for while using this medicine? Visit your doctor or health care professional for regular checks on your progress. Drink plenty of fluids while taking this medicine. Check with your doctor or health care professional if you get an attack of severe diarrhea, nausea, and vomiting. The loss of too much body fluid can make it dangerous for you to take this medicine. A test called the HbA1C (A1C) will be monitored. This is a simple blood test. It measures your blood sugar control over the last 2 to 3 months. You will receive this test every 3 to 6 months. Learn how to check your blood sugar. Learn the  symptoms of low and high blood sugar and how to manage them. Always carry a quick-source of sugar with you in case you have symptoms of low blood sugar. Examples include hard sugar candy or glucose tablets. Make sure others know that you can choke if you eat or drink when you develop serious symptoms of low blood sugar, such as seizures or unconsciousness. They must get medical help at once. Tell your doctor or health care professional if you have high blood sugar. You might need to change the dose of your  medicine. If you are sick or exercising more than usual, you might need to change the dose of your medicine. Do not skip meals. Ask your doctor or health care professional if you should avoid alcohol. Many nonprescription cough and cold products contain sugar or alcohol. These can affect blood sugar. Pens should never be shared. Even if the needle is changed, sharing may result in passing of viruses like hepatitis or HIV. Wear a medical ID bracelet or chain, and carry a card that describes your disease and details of your medicine and dosage times. Do not become pregnant while taking this medicine. Women should inform their doctor if they wish to become pregnant or think they might be pregnant. There is a potential for serious side effects to an unborn child. Talk to your health care professional or pharmacist for more information. What side effects may I notice from receiving this medicine? Side effects that you should report to your doctor or health care professional as soon as possible:  allergic reactions like skin rash, itching or hives, swelling of the face, lips, or tongue  breathing problems  changes in vision  diarrhea that continues or is severe  lump or swelling on the neck  severe nausea  signs and symptoms of infection like fever or chills; cough; sore throat; pain or trouble passing urine  signs and symptoms of low blood sugar such as feeling anxious, confusion, dizziness, increased hunger, unusually weak or tired, sweating, shakiness, cold, irritable, headache, blurred vision, fast heartbeat, loss of consciousness  signs and symptoms of kidney injury like trouble passing urine or change in the amount of urine  trouble swallowing  unusual stomach upset or pain  vomiting Side effects that usually do not require medical attention (report to your doctor or health care professional if they continue or are bothersome):  constipation  diarrhea  nausea  pain, redness, or  irritation at site where injected  stomach upset This list may not describe all possible side effects. Call your doctor for medical advice about side effects. You may report side effects to FDA at 1-800-FDA-1088. Where should I keep my medicine? Keep out of the reach of children. Store unopened pens in a refrigerator between 2 and 8 degrees C (36 and 46 degrees F). Do not freeze. Protect from light and heat. After you first use the pen, it can be stored for 56 days at room temperature between 15 and 30 degrees C (59 and 86 degrees F) or in a refrigerator. Throw away your used pen after 56 days or after the expiration date, whichever comes first. Do not store your pen with the needle attached. If the needle is left on, medicine may leak from the pen. NOTE: This sheet is a summary. It may not cover all possible information. If you have questions about this medicine, talk to your doctor, pharmacist, or health care provider.  2020 Elsevier/Gold Standard (2019-08-13 09:41:51)

## 2020-12-24 LAB — CYTOLOGY - PAP
Chlamydia: NEGATIVE
Comment: NEGATIVE
Comment: NEGATIVE
Comment: NEGATIVE
Comment: NORMAL
Diagnosis: NEGATIVE
HSV1: NEGATIVE
HSV2: NEGATIVE
Neisseria Gonorrhea: NEGATIVE
Trichomonas: NEGATIVE

## 2020-12-24 NOTE — Progress Notes (Signed)
Iron deficiency anemia, she should start Iron as prescribed. Follow up advised in 3 months, also labs inn 3 months to recheck iron tibc wand cbc.

## 2020-12-27 DIAGNOSIS — D509 Iron deficiency anemia, unspecified: Secondary | ICD-10-CM | POA: Insufficient documentation

## 2020-12-27 DIAGNOSIS — R635 Abnormal weight gain: Secondary | ICD-10-CM | POA: Insufficient documentation

## 2020-12-28 ENCOUNTER — Telehealth: Payer: Self-pay | Admitting: Adult Health

## 2020-12-28 NOTE — Telephone Encounter (Signed)
Alexa Osborne calling [employer] calling to get clarity on gentic testing code entered was for family testing. Please advise Cb- [phone]

## 2020-12-29 NOTE — Telephone Encounter (Signed)
Spoke with Sharyn Lull [facility] is stating for genetic family testing in order for code to go through they would need proof or documentation from family member. We do not have documentation. Per Sharyn Lull patient will be following up with hematology so she will be able to address concern then. KW

## 2020-12-30 LAB — BETA THALASSEMIA: HBB FAMILY

## 2020-12-30 NOTE — Progress Notes (Signed)
Iron deficiency anemia, she should start Iron as prescribed. Follow up advised in 3 months, also labs inn 3 months to recheck iron tibc wand cbc. Beta Thal test could not be performed, can be referred to hematology if she wants testing.

## 2021-01-07 LAB — CBC WITH DIFFERENTIAL/PLATELET
Basophils Absolute: 0 10*3/uL (ref 0.0–0.2)
Basos: 1 %
EOS (ABSOLUTE): 0.1 10*3/uL (ref 0.0–0.4)
Eos: 1 %
Hematocrit: 33.1 % — ABNORMAL LOW (ref 34.0–46.6)
Hemoglobin: 9.9 g/dL — ABNORMAL LOW (ref 11.1–15.9)
Immature Grans (Abs): 0 10*3/uL (ref 0.0–0.1)
Immature Granulocytes: 0 %
Lymphocytes Absolute: 2.7 10*3/uL (ref 0.7–3.1)
Lymphs: 32 %
MCH: 20 pg — ABNORMAL LOW (ref 26.6–33.0)
MCHC: 29.9 g/dL — ABNORMAL LOW (ref 31.5–35.7)
MCV: 67 fL — ABNORMAL LOW (ref 79–97)
Monocytes Absolute: 0.4 10*3/uL (ref 0.1–0.9)
Monocytes: 5 %
Neutrophils Absolute: 5 10*3/uL (ref 1.4–7.0)
Neutrophils: 61 %
Platelets: 330 10*3/uL (ref 150–450)
RBC: 4.95 x10E6/uL (ref 3.77–5.28)
RDW: 17.5 % — ABNORMAL HIGH (ref 11.7–15.4)
WBC: 8.3 10*3/uL (ref 3.4–10.8)

## 2021-01-07 LAB — IRON,TIBC AND FERRITIN PANEL
Ferritin: 5 ng/mL — ABNORMAL LOW (ref 15–150)
Iron Saturation: 6 % — CL (ref 15–55)
Iron: 23 ug/dL — ABNORMAL LOW (ref 27–159)
Total Iron Binding Capacity: 390 ug/dL (ref 250–450)
UIBC: 367 ug/dL (ref 131–425)

## 2021-01-07 NOTE — Progress Notes (Signed)
Did we ever reach patient with lab results- I know voicemail was full when last attempted.

## 2021-02-08 ENCOUNTER — Other Ambulatory Visit: Payer: Self-pay | Admitting: Adult Health

## 2021-02-09 ENCOUNTER — Encounter: Payer: Self-pay | Admitting: Gastroenterology

## 2021-02-09 ENCOUNTER — Ambulatory Visit: Payer: 59 | Admitting: Gastroenterology

## 2021-02-26 ENCOUNTER — Other Ambulatory Visit: Payer: Self-pay | Admitting: Adult Health

## 2021-05-24 ENCOUNTER — Other Ambulatory Visit: Payer: Self-pay | Admitting: Family Medicine

## 2021-05-24 DIAGNOSIS — D509 Iron deficiency anemia, unspecified: Secondary | ICD-10-CM

## 2021-05-24 NOTE — Telephone Encounter (Signed)
[facility] faxed refill request for the following medications:  escitalopram (LEXAPRO) 10 MG tablet  2. pantoprazole (PROTONIX) 40 MG tablet   LOV: 12/18/20 Please advise. Thanks TNP

## 2021-05-25 MED ORDER — ESCITALOPRAM OXALATE 10 MG PO TABS
10.0000 mg | ORAL_TABLET | Freq: Every day | ORAL | 0 refills | Status: DC
Start: 2021-05-25 — End: 2022-02-21

## 2021-05-25 MED ORDER — PANTOPRAZOLE SODIUM 40 MG PO TBEC: 40.0000 mg | DELAYED_RELEASE_TABLET | Freq: Every day | ORAL | 1 refills | Status: DC

## 2021-06-17 ENCOUNTER — Ambulatory Visit: Payer: 59 | Admitting: Adult Health

## 2021-07-15 ENCOUNTER — Other Ambulatory Visit: Payer: Self-pay | Admitting: Adult Health

## 2021-07-15 MED ORDER — ALBUTEROL SULFATE HFA 108 (90 BASE) MCG/ACT IN AERS: INHALATION_SPRAY | RESPIRATORY_TRACT | 1 refills | Status: DC

## 2021-07-15 NOTE — Telephone Encounter (Signed)
[facility] faxed refill request for the following medications:   albuterol (VENTOLIN HFA) 108 (90 Base) MCG/ACT inhaler   Please advise.  

## 2021-09-15 ENCOUNTER — Other Ambulatory Visit: Payer: Self-pay

## 2021-09-15 ENCOUNTER — Emergency Department: Payer: Self-pay

## 2021-09-15 ENCOUNTER — Encounter: Payer: Self-pay | Admitting: Emergency Medicine

## 2021-09-15 ENCOUNTER — Emergency Department
Admission: EM | Admit: 2021-09-15 | Discharge: 2021-09-15 | Disposition: A | Discharge status: Home / Self Care | Payer: Self-pay | Attending: Emergency Medicine | Admitting: Emergency Medicine

## 2021-09-15 DIAGNOSIS — J40 Bronchitis, not specified as acute or chronic: Secondary | ICD-10-CM | POA: Insufficient documentation

## 2021-09-15 DIAGNOSIS — F17210 Nicotine dependence, cigarettes, uncomplicated: Secondary | ICD-10-CM | POA: Insufficient documentation

## 2021-09-15 DIAGNOSIS — Z794 Long term (current) use of insulin: Secondary | ICD-10-CM | POA: Insufficient documentation

## 2021-09-15 DIAGNOSIS — D509 Iron deficiency anemia, unspecified: Secondary | ICD-10-CM | POA: Insufficient documentation

## 2021-09-15 DIAGNOSIS — J45909 Unspecified asthma, uncomplicated: Secondary | ICD-10-CM | POA: Insufficient documentation

## 2021-09-15 DIAGNOSIS — R053 Chronic cough: Secondary | ICD-10-CM

## 2021-09-15 LAB — TROPONIN I (HIGH SENSITIVITY): Troponin I (High Sensitivity): 4 ng/L (ref <18)

## 2021-09-15 LAB — CBC
HCT: 27.2 % — ABNORMAL LOW (ref 36.0–46.0)
Hemoglobin: 8 g/dL — ABNORMAL LOW (ref 12.0–15.0)
MCH: 18.6 pg — ABNORMAL LOW (ref 26.0–34.0)
MCHC: 29.4 g/dL — ABNORMAL LOW (ref 30.0–36.0)
MCV: 63.3 fL — ABNORMAL LOW (ref 80.0–100.0)
Platelets: 347 10*3/uL (ref 150–400)
RBC: 4.3 MIL/uL (ref 3.87–5.11)
RDW: 19.7 % — ABNORMAL HIGH (ref 11.5–15.5)
WBC: 8.8 10*3/uL (ref 4.0–10.5)
nRBC: 0 % (ref 0.0–0.2)

## 2021-09-15 LAB — BASIC METABOLIC PANEL
Anion gap: 7 (ref 5–15)
BUN: 8 mg/dL (ref 6–20)
CO2: 24 mmol/L (ref 22–32)
Calcium: 8.8 mg/dL — ABNORMAL LOW (ref 8.9–10.3)
Chloride: 105 mmol/L (ref 98–111)
Creatinine, Ser: 0.61 mg/dL (ref 0.44–1.00)
GFR, Estimated: >60 mL/min (ref >60)
Glucose, Bld: 96 mg/dL (ref 70–99)
Potassium: 3.4 mmol/L — ABNORMAL LOW (ref 3.5–5.1)
Sodium: 136 mmol/L (ref 135–145)

## 2021-09-15 LAB — D-DIMER, QUANTITATIVE: D-Dimer, Quant: 2.14 ug/mL-FEU — ABNORMAL HIGH (ref 0.00–0.50)

## 2021-09-15 MED ORDER — DOCUSATE SODIUM 100 MG PO CAPS: 100.0000 mg | ORAL_CAPSULE | Freq: Two times a day (BID) | ORAL | 0 refills | Status: AC

## 2021-09-15 MED ORDER — IOHEXOL 350 MG/ML SOLN
75.0000 mL | Freq: Once | INTRAVENOUS | Status: AC | PRN
  Administered 2021-09-15: 75 mL via INTRAVENOUS

## 2021-09-15 MED ORDER — OLOPATADINE HCL 0.1 % OP SOLN: 1.0000 [drp] | Freq: Two times a day (BID) | OPHTHALMIC | 0 refills | Status: AC

## 2021-09-15 MED ORDER — ALBUTEROL SULFATE (2.5 MG/3ML) 0.083% IN NEBU
2.5000 mg | INHALATION_SOLUTION | Freq: Once | RESPIRATORY_TRACT | Status: AC
  Administered 2021-09-15: 2.5 mg via RESPIRATORY_TRACT
  Filled 2021-09-15: qty 3

## 2021-09-15 MED ORDER — PREDNISONE 20 MG PO TABS: 40.0000 mg | ORAL_TABLET | Freq: Every day | ORAL | 0 refills | Status: AC

## 2021-09-15 MED ORDER — ALBUTEROL SULFATE HFA 108 (90 BASE) MCG/ACT IN AERS: 2.0000 | INHALATION_SPRAY | RESPIRATORY_TRACT | 1 refills | Status: DC | PRN

## 2021-09-15 NOTE — ED Provider Notes (Signed)
[facility] Emergency Department Provider Note ____________________________________________   Event Date/Time   First MD Initiated Contact with Patient 09/15/21 1005     (approximate)  I have reviewed the triage vital signs and the nursing notes.   HISTORY  Chief Complaint Dizziness and Eye Twitching    HPI CRYSTALLEE WERDEN is a 45 y.o. female with PMH as noted below including asthma, iron deficiency anemia, and GERD who presents with multiple complaints:  Cough: The patient has had a chronic cough since she had COVID last March.  It is nonproductive and not associated with chest pain or fever.  However, it has worsened somewhat in the last few weeks and is associated with wheezing.  She denies any shortness of breath when at rest.  She does have some mild shortness of breath with exertion.  Dizziness: The patient describes this as lightheadedness and it is also chronic for at least the last several months.  It is worse when she stands up.  The patient states that yesterday she stood up, got lightheaded, and felt like she was about to pass out, causing her to fall.  She did not lose consciousness.  She was caught by her husband and did not hit her head or injure herself.  Eye twitching: The patient reports twitching to the left eyelid over the last few days associated with mild swelling to the eyelid and some irritation to the eye but no itching or pain.  She denies any vision changes.   Past Medical History:  Diagnosis Date   Acid reflux    Allergy    Anxiety    Asthma    Depression     Patient Active Problem List   Diagnosis Date Noted   Weight gain 12/27/2020   Iron deficiency anemia 12/27/2020   Chronic pain of left ankle 11/15/2020   Acute left ankle pain 11/15/2020   No-show for appointment 11/13/2020   Screening for colon cancer 11/13/2020   Vitamin D insufficiency 11/13/2020   Left knee pain 11/13/2020   Gastroesophageal reflux disease  without esophagitis 11/13/2020   Mild intermittent asthma without complication [id number]/[id number]    Past Surgical History:  Procedure Laterality Date   DILATION AND CURETTAGE OF UTERUS     TONSILLECTOMY     TUBAL LIGATION      Prior to Admission medications   Medication Sig Start Date End Date Taking? Authorizing Provider  albuterol (VENTOLIN HFA) 108 (90 Base) MCG/ACT inhaler Inhale 2 puffs into the lungs every 4 (four) hours as needed for wheezing or shortness of breath. 09/15/21  Yes Arta Silence, MD  docusate sodium (COLACE) 100 MG capsule Take 1 capsule (100 mg total) by mouth 2 (two) times daily. 09/15/21 09/15/22 Yes Arta Silence, MD  olopatadine (PATANOL) 0.1 % ophthalmic solution Place 1 drop into the left eye 2 (two) times daily for 5 days. 09/15/21 09/20/21 Yes Arta Silence, MD  predniSONE (DELTASONE) 20 MG tablet Take 2 tablets (40 mg total) by mouth daily with breakfast for 5 days. 09/15/21 09/20/21 Yes Arta Silence, MD  albuterol (VENTOLIN HFA) 108 (90 Base) MCG/ACT inhaler INHALE 1 TO 2 PUFFS INTO THE LUNGS EVERY 6 HOURS AS NEEDED FOR WHEEZING OR SHORTNESS OF BREATH 07/15/21   Chrismon, Vickki Muff, PA-C  escitalopram (LEXAPRO) 10 MG tablet Take 1 tablet (10 mg total) by mouth daily. 05/25/21   Jerrol Banana., MD  ferrous sulfate (FERROUSUL) 325 (65 FE) MG tablet Take 1 tablet (325 mg total) by  mouth daily with breakfast. 12/18/20   Flinchum, Kelby Aline, FNP  fexofenadine-pseudoephedrine (ALLEGRA-D) 60-120 MG 12 hr tablet Take 1 tablet by mouth 2 (two) times daily. Patient not taking: Reported on 12/18/2020 10/18/18   Sable Feil, PA-C  fluconazole (DIFLUCAN) 150 MG tablet Take 1 tablet (150 mg total) by mouth as directed. Take one tablet by mouth on day 1. May repeat dose of one tablet by mouth on day four. Patient not taking: Reported on 12/18/2020 11/13/20   Flinchum, Kelby Aline, FNP  lidocaine (XYLOCAINE) 2 % jelly 1 application by Other route 3 (three) times  daily. Patient not taking: Reported on 12/18/2020 12/14/18   Nance Pear, MD  neomycin-polymyxin-hydrocortisone (CORTISPORIN) 3.5-10000-1 OTIC suspension Place 3 drops into the right ear 4 (four) times daily. Patient not taking: Reported on 12/18/2020 11/13/20   Flinchum, Kelby Aline, FNP  Nitroglycerin (RECTIV) 0.4 % OINT Place 1 application rectally 2 (two) times daily for 7 days. 12/14/18 12/21/18  Nance Pear, MD  pantoprazole (PROTONIX) 40 MG tablet Take 1 tablet (40 mg total) by mouth daily. 05/25/21 11/21/21  Jerrol Banana., MD  Semaglutide-[facility]) 0.25 MG/0.5ML SOAJ Inject 0.25 mg into the skin once a week. 12/18/20   Flinchum, Kelby Aline, FNP  Vitamin D, Ergocalciferol, (DRISDOL) 1.25 MG (50000 UNIT) CAPS capsule TAKE 1 CAPSULE BY MOUTH EVERY 7 DAYS(TAKING 1 CAPSULE PER WEEK) WALK IN LAB IN OFFICE 1- 2 WEEKS AFTER COMPLETING PRESCRIPTION 02/08/21   Flinchum, Kelby Aline, FNP    Allergies Patient has no known allergies.  Family History  Problem Relation Age of Onset   Diabetes Mother    Kidney failure Mother    Hypertension Mother    Heart failure Mother    Hypertension Father    Diabetes Father    Gout Father     Social History Social History   Tobacco Use   Smoking status: Every Day    Packs/day: 1.00    Types: Cigarettes   Smokeless tobacco: Never  Substance Use Topics   Alcohol use: No   Drug use: No    Review of Systems  Constitutional: No fever. Eyes: No visual changes. ENT: No sore throat. Cardiovascular: Denies chest pain. Respiratory: Positive for cough. Gastrointestinal: No vomiting or diarrhea.  No blood in the stool or melena. Genitourinary: Negative for dysuria and hematuria. Musculoskeletal: Negative for back pain. Skin: Negative for rash. Neurological: Negative for headaches, focal weakness or numbness.   ____________________________________________   PHYSICAL EXAM:  VITAL SIGNS: ED Triage Vitals  Enc Vitals Group      BP 09/15/21 0928 134/84     Pulse Rate 09/15/21 0928 85     Resp 09/15/21 0928 18     Temp 09/15/21 0928 97.8 F (36.6 C)     Temp Source 09/15/21 0928 Oral     SpO2 09/15/21 0928 95 %     Weight 09/15/21 0929 (!) 325 lb 6.4 oz (147.6 kg)     Height 09/15/21 0929 5\' 3"  (1.6 m)     Head Circumference --      Peak Flow --      Pain Score 09/15/21 0929 5     Pain Loc --      Pain Edu? --      Excl. in [place]? --     Constitutional: Alert and oriented. Well appearing and in no acute distress. Eyes: Conjunctivae are normal.  No injection or discharge.  EOMI.  PERRLA.  Mild left eyelid swelling with  no erythema, induration, or abnormal warmth. Head: Atraumatic. Nose: No congestion/rhinnorhea. Mouth/Throat: Mucous membranes are moist.   Neck: Normal range of motion.  Cardiovascular: Normal rate, regular rhythm. Grossly normal heart sounds.  Good peripheral circulation. Respiratory: Normal respiratory effort.  No retractions.  Faint wheezing bilaterally.  Good air entry. Gastrointestinal: No distention.  Musculoskeletal: No lower extremity edema.  Extremities warm and well perfused.  Neurologic:  Normal speech and language. No gross focal neurologic deficits are appreciated.  Skin:  Skin is warm and dry. No rash noted. Psychiatric: Mood and affect are normal. Speech and behavior are normal.  ____________________________________________   LABS (all labs ordered are listed, but only abnormal results are displayed)  Labs Reviewed  BASIC METABOLIC PANEL - Abnormal; Notable for the following components:      Result Value   Potassium 3.4 (*)    Calcium 8.8 (*)    All other components within normal limits  CBC - Abnormal; Notable for the following components:   Hemoglobin 8.0 (*)    HCT 27.2 (*)    MCV 63.3 (*)    MCH 18.6 (*)    MCHC 29.4 (*)    RDW 19.7 (*)    All other components within normal limits  D-DIMER, QUANTITATIVE - Abnormal; Notable for the following components:    D-Dimer, Quant 2.14 (*)    All other components within normal limits  URINALYSIS, COMPLETE (UACMP) WITH MICROSCOPIC  CBG MONITORING, ED  TROPONIN I (HIGH SENSITIVITY)   ____________________________________________  EKG  ED ECG REPORT I, Arta Silence, the attending physician, personally viewed and interpreted this ECG.  Date: 09/15/2021 EKG Time: 0933 Rate: 84 Rhythm: normal sinus rhythm QRS Axis: Left axis Intervals: RBBB ST/T Wave abnormalities: normal Narrative Interpretation: no evidence of acute ischemia  ____________________________________________  RADIOLOGY  Chest x-ray interpreted by me shows no focal consolidation or edema  CT angio chest: IMPRESSION:  1. No evidence of pulmonary embolism.  2. Subtle mosaic attenuation in the lung bases suggest air  trapping/small airway disease.  3. 5 mm right solid pulmonary nodule. No routine follow-up imaging  is recommended per Fleischner Society Guidelines. These guidelines  do not apply to immunocompromised patients and patients with cancer.  Follow up in patients with significant comorbidities as clinically  warranted. For lung cancer screening, adhere to Lung-RADS  guidelines. Reference: Radiology. 2017; 284(1):228-43.  4. 1.5 cm left adrenal adenoma.    ____________________________________________   PROCEDURES  Procedure(s) performed: No  Procedures  Critical Care performed: No ____________________________________________   INITIAL IMPRESSION / ASSESSMENT AND PLAN / ED COURSE  Pertinent labs & imaging results that were available during my care of the patient were reviewed by me and considered in my medical decision making (see chart for details).   45 year old female with PMH as noted above presents with chronic cough over the last several months since she had COVID earlier in the year, acutely worsened and associated with wheezing.  In addition she has some twitching to the left thigh, and reports  lightheadedness and a sensation of presyncope this morning.  She has no chest pain.  On exam the patient is well-appearing.  Her vital signs are normal.  The physical exam is unremarkable except for bilateral wheezing on lung exam.  Neurologic exam is nonfocal.  Cough: This is most likely asthma exacerbation, acute bronchitis, or post-COVID symptoms.  The patient has no symptoms of acute infection.  We will obtain a chest x-ray and give albuterol.  Dizziness: The patient's hemoglobin is  8 and it appears that her baseline over the last year has been closer to 10.  The patient has a history of anemia and denies any new bleeding.  She is supposed to be on iron but does not take it because it makes her constipated.  I suspect that the dizziness is due to the anemia.  The patient has no active bleeding or symptoms of acute blood loss.  There is no indication for transfusion.  Her vital signs are stable.  EKG is nonischemic but shows RBBB and left axis, with no prior EKG available for comparison.  We will add on a troponin and D-dimer.  We will plan to have the patient restart her iron and follow-up with her PMD.  Eye twitching: There is no visible eye twitching during my exam.  She has mild swelling to the left eyelid and reports increased tearing with no abnormality to the eye itself on exam.  This is most likely allergic in nature and we will prescribe Patanol eyedrops.  ----------------------------------------- 2:28 PM on 09/15/2021 -----------------------------------------  Troponin was negative.  There is no indication for repeat given the duration of the symptoms.  The D-dimer was elevated so I obtained a CT chest which is negative for PE and confirm small airways disease.  On reassessment, the patient continues to appear comfortable.  She is stable for discharge home.  I counseled her on the results of the work-up.  I prescribed albuterol and prednisone, as well as Patanol for the eye.  I have  instructed her to restart her iron pills (which she says she has at home) and have prescribed Colace.  Return precautions given, and she expresses understanding.  ____________________________________________   FINAL CLINICAL IMPRESSION(S) / ED DIAGNOSES  Final diagnoses:  Chronic cough  Bronchitis  Iron deficiency anemia, unspecified iron deficiency anemia type      NEW MEDICATIONS STARTED DURING THIS VISIT:  New Prescriptions   ALBUTEROL (VENTOLIN HFA) 108 (90 BASE) MCG/ACT INHALER    Inhale 2 puffs into the lungs every 4 (four) hours as needed for wheezing or shortness of breath.   DOCUSATE SODIUM (COLACE) 100 MG CAPSULE    Take 1 capsule (100 mg total) by mouth 2 (two) times daily.   OLOPATADINE (PATANOL) 0.1 % OPHTHALMIC SOLUTION    Place 1 drop into the left eye 2 (two) times daily for 5 days.   PREDNISONE (DELTASONE) 20 MG TABLET    Take 2 tablets (40 mg total) by mouth daily with breakfast for 5 days.     Note:  This document was prepared using Dragon voice recognition software and may include unintentional dictation errors.    Arta Silence, MD 09/15/21 1430

## 2021-09-15 NOTE — ED Triage Notes (Signed)
Pt comes into the ED via POV c/o dizziness and eye twitching that has been ongoing for over 2 months.  Pt states that she has the dizziness all the time even when sitting.  Pt states she had COVID in [place] and since then she has had continual problems.  Pt ambulatory to triage at this time with even and unlabored respirations.  Pt neurologically intact at this time.

## 2021-09-15 NOTE — Discharge Instructions (Addendum)
Take the prednisone as prescribed for the next 5 days.  This will decrease inflammation in your lungs.  Use the albuterol inhaler up to every 4 hours as needed for cough, wheezing, or shortness of breath.  Restart your iron pills daily, and you may take the Colace prescribed today to help prevent constipation.  Use the Patanol eyedrops as prescribed over the next several days.  Return to the ER for new, worsening, or persistent severe shortness of breath, chest pain, weakness or dizziness, feeling like you are going to pass out, actually passing out, any abnormal bleeding or bruising, worsening discomfort or swelling to the eyelid, any change in vision or loss of vision, or any other new or worsening symptoms that concern you.    Follow-up with your primary care doctor.

## 2021-09-15 NOTE — ED Triage Notes (Signed)
First Nurse NOte:  C/O persistent cough since having COVID in March.  Patient states she is a smoker.  Also C/O dizziness with coughing and 'all of the time' even when sitting.  Patient is AAOx3.  Skin warm and dry. NAD.  MAE equally and strong.  Gait steady. Posture upright and relaxed. NAD

## 2021-11-25 IMAGING — CT CT ANGIO CHEST
2 of 6 series · 18 of 46 positions shown · IV contrast (APPLIED)
Comparison: Same day chest radiograph

CLINICAL DATA: Dizziness, eye twitching

EXAM:
CT ANGIOGRAPHY CHEST WITH CONTRAST
TECHNIQUE: Multidetector CT imaging of the chest was performed using the
standard protocol during bolus administration of intravenous
contrast. Multiplanar CT image reconstructions and MIPs were
obtained to evaluate the vascular anatomy.
CONTRAST:  75mL OMNIPAQUE IOHEXOL 350 MG/ML SOLN

[Series 5: thins · axial · 0.71mm/px · z∈[-366,-110]mm · 16 of 282 slices shown]
[im 13/282  lung]
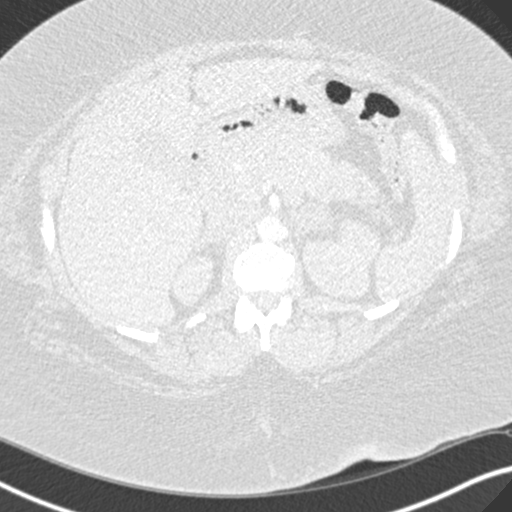
[im 37/282  soft-tissue]
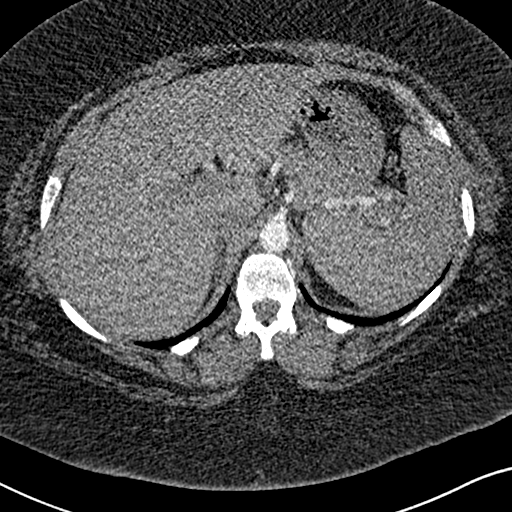
[im 49/282  lung]
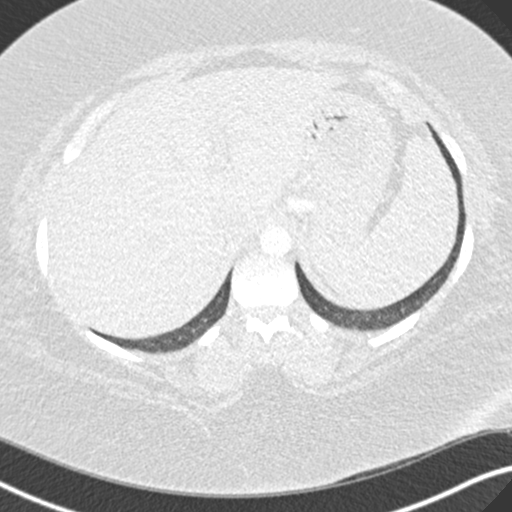
[im 62/282  soft-tissue]
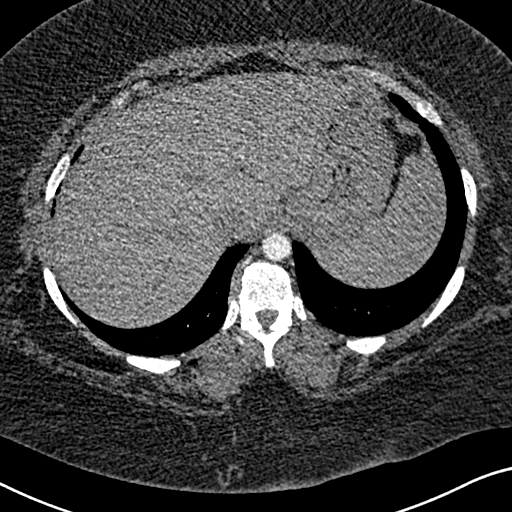
[im 86/282  lung]
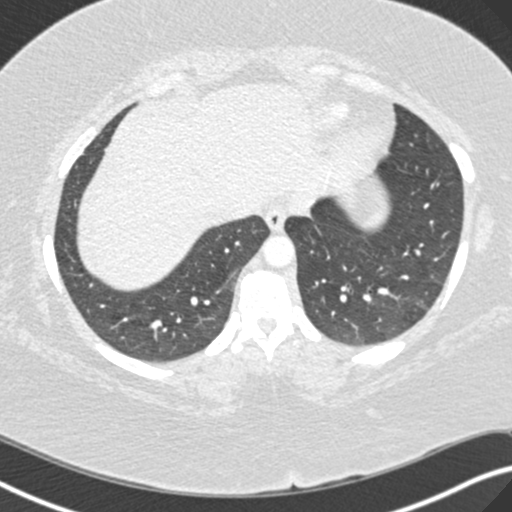
[im 98/282  soft-tissue]
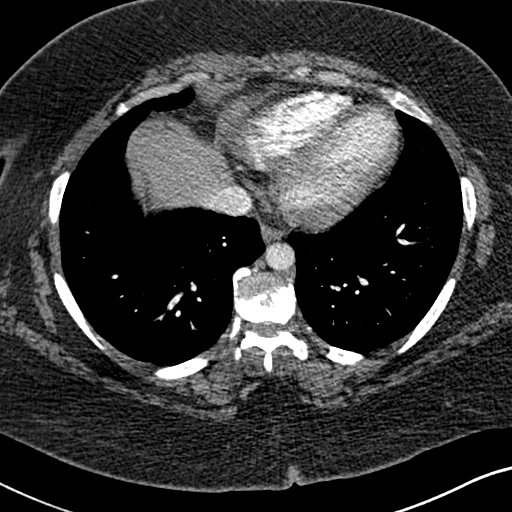
[im 110/282  lung]
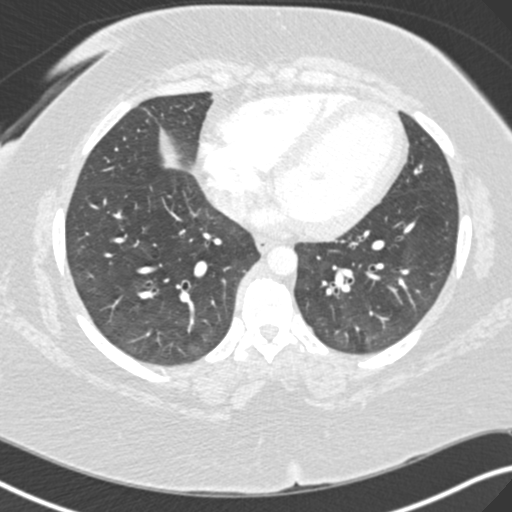
[im 135/282  soft-tissue]
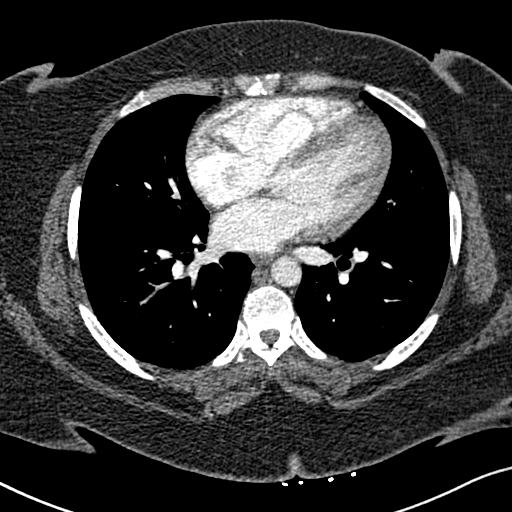
[im 147/282  lung]
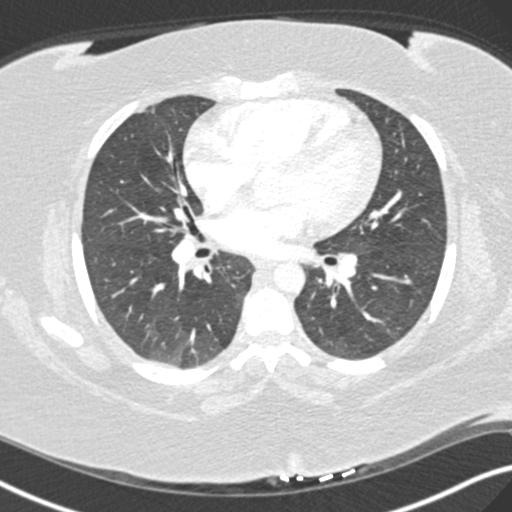
[im 172/282  soft-tissue]
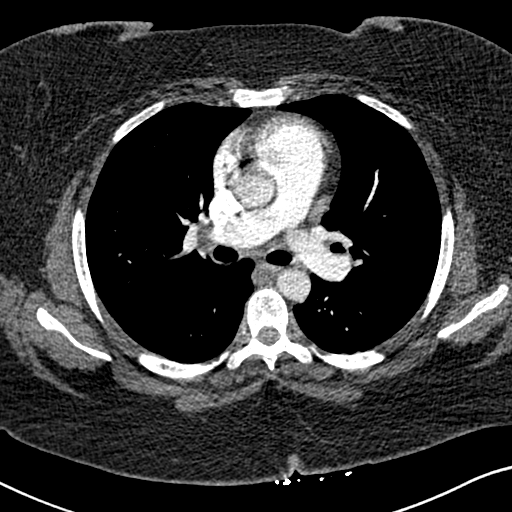
[im 184/282  lung]
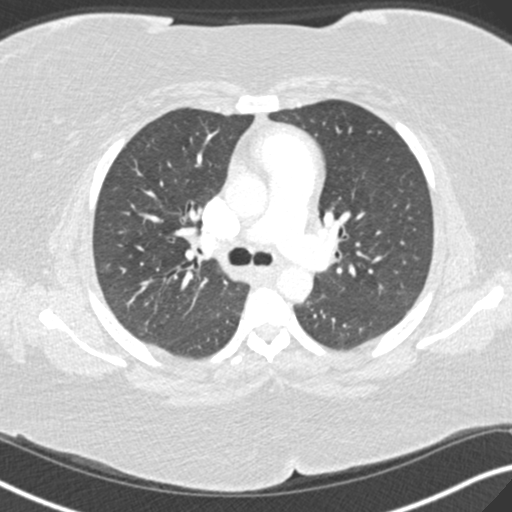
[im 196/282  soft-tissue]
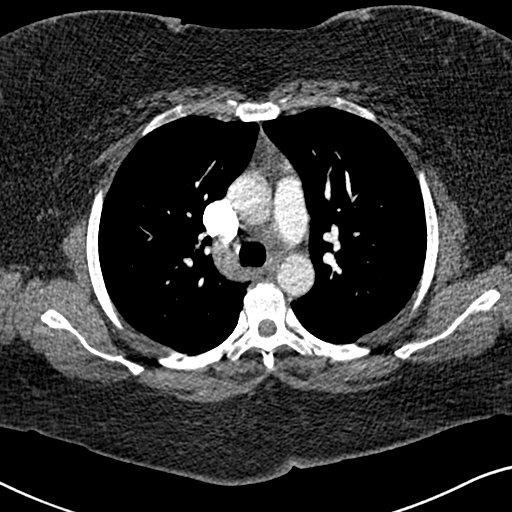
[im 220/282  lung]
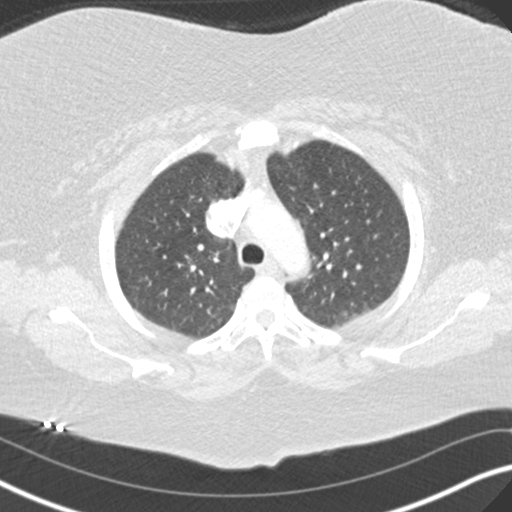
[im 233/282  soft-tissue]
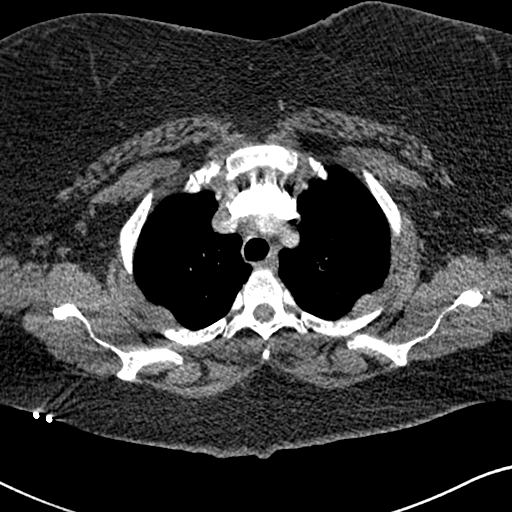
[im 245/282  lung]
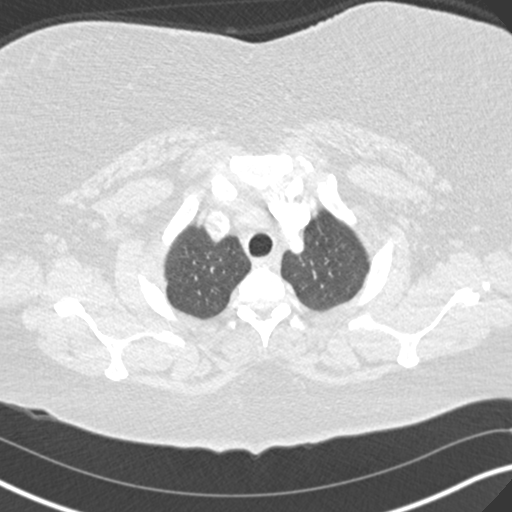
[im 269/282  soft-tissue]
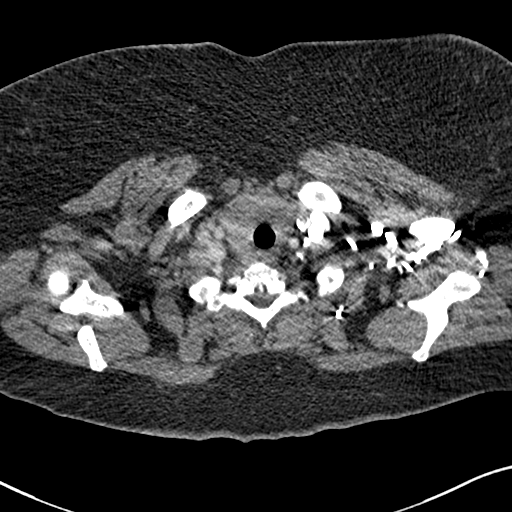

[Series 7: coronal mpr · coronal · 0.55mm/px · 2 of 102 slices shown]
[im 34/102  soft-tissue]
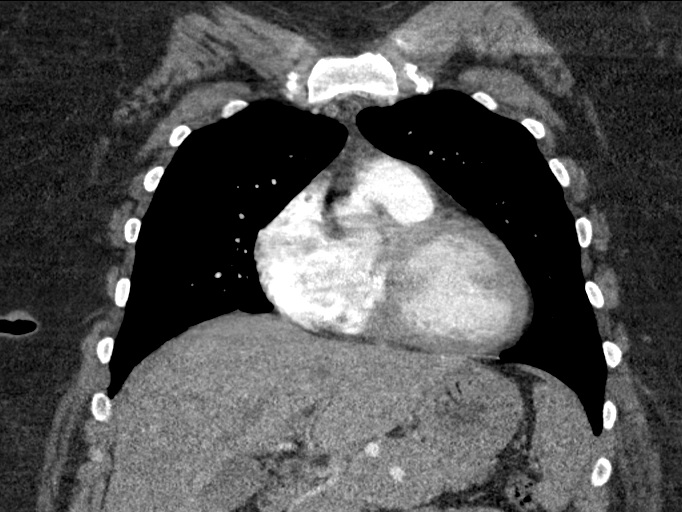
[im 68/102  soft-tissue]
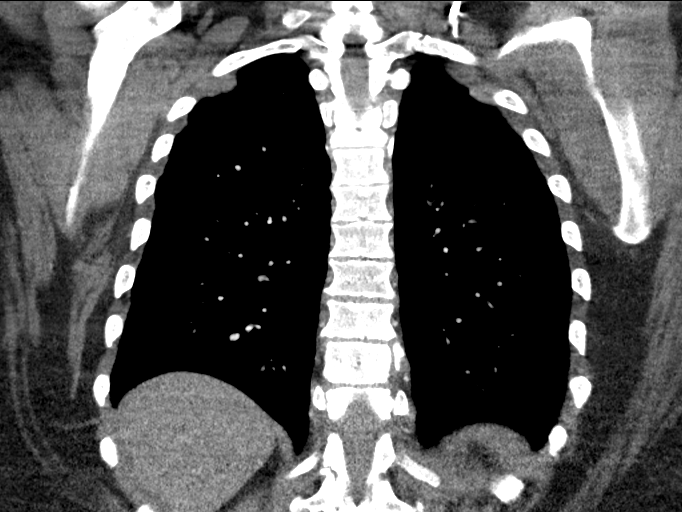

[18 of 46 positions shown; findings below may reference images not displayed]

FINDINGS: Cardiovascular: There is adequate opacification of the pulmonary
arteries to the segmental level. There is no evidence of pulmonary
embolism. The heart is at the upper limits of normal for size. There
is no pericardial effusion. The thoracic aorta is unremarkable.

Mediastinum/Nodes: The imaged thyroid is unremarkable. The esophagus
is grossly unremarkable. There is no mediastinal, hilar, or axillary
lymphadenopathy.

Lungs/Pleura: The trachea and central airways are patent.

There is no focal consolidation or pulmonary edema. There is no
pleural effusion or pneumothorax. There is subtle mosaic attenuation
in the lung bases. There is a 5 mm nodule in the lateral segment of
the right middle lobe (6-47).

Upper Abdomen: There is a 1.5 cm left adrenal nodule measuring fat
attenuation.

Musculoskeletal: There is mild multilevel degenerative change of the
thoracic spine.

Review of the MIP images confirms the above findings.
IMPRESSION: 1. No evidence of pulmonary embolism.
2. Subtle mosaic attenuation in the lung bases suggest air
trapping/small airway disease.
3. 5 mm right solid pulmonary nodule. No routine follow-up imaging
is recommended per [HOSPITAL] Guidelines. These guidelines
do not apply to immunocompromised patients and patients with cancer.
Follow up in patients with significant comorbidities as clinically
warranted. For lung cancer screening, adhere to Lung-RADS
guidelines. Reference: Radiology. 4438; 284(1):228-43.
4. 1.5 cm left adrenal adenoma.

## 2022-02-10 NOTE — Progress Notes (Deleted)
? ?Established Patient Office Visit ? ?Subjective:  ?Patient ID: Alexa Osborne, female    DOB: [birth year]  Age: 46 y.o. MRN: [id number] ? ?CC: No chief complaint on file. ? ? ?HPI ?Alexa Osborne presents for *** ? ?Past Medical History:  ?Diagnosis Date  ? Acid reflux   ? Allergy   ? Anxiety   ? Asthma   ? Depression   ? ? ?Past Surgical History:  ?Procedure Laterality Date  ? DILATION AND CURETTAGE OF UTERUS    ? TONSILLECTOMY    ? TUBAL LIGATION    ? ? ?Family History  ?Problem Relation Age of Onset  ? Diabetes Mother   ? Kidney failure Mother   ? Hypertension Mother   ? Heart failure Mother   ? Hypertension Father   ? Diabetes Father   ? Gout Father   ? ? ?Social History  ? ?Socioeconomic History  ? Marital status: Single  ?  Spouse name: Not on file  ? Number of children: Not on file  ? Years of education: Not on file  ? Highest education level: Not on file  ?Occupational History  ? Not on file  ?Tobacco Use  ? Smoking status: Every Day  ?  Packs/day: 1.00  ?  Types: Cigarettes  ? Smokeless tobacco: Never  ?Substance and Sexual Activity  ? Alcohol use: No  ? Drug use: No  ? Sexual activity: Yes  ?Other Topics Concern  ? Not on file  ?Social History Narrative  ? Not on file  ? ?Social Determinants of Health  ? ?Financial Resource Strain: Not on file  ?Food Insecurity: Not on file  ?Transportation Needs: Not on file  ?Physical Activity: Not on file  ?Stress: Not on file  ?Social Connections: Not on file  ?Intimate Partner Violence: Not on file  ? ? ?Outpatient Medications Prior to Visit  ?Medication Sig Dispense Refill  ? albuterol (VENTOLIN HFA) 108 (90 Base) MCG/ACT inhaler INHALE 1 TO 2 PUFFS INTO THE LUNGS EVERY 6 HOURS AS NEEDED FOR WHEEZING OR SHORTNESS OF BREATH 18 g 1  ? albuterol (VENTOLIN HFA) 108 (90 Base) MCG/ACT inhaler Inhale 2 puffs into the lungs every 4 (four) hours as needed for wheezing or shortness of breath. 8 g 1  ? docusate sodium (COLACE) 100 MG capsule Take 1 capsule (100 mg total) by  mouth 2 (two) times daily. 30 capsule 0  ? escitalopram (LEXAPRO) 10 MG tablet Take 1 tablet (10 mg total) by mouth daily. 90 tablet 0  ? ferrous sulfate (FERROUSUL) 325 (65 FE) MG tablet Take 1 tablet (325 mg total) by mouth daily with breakfast. 90 tablet 1  ? fexofenadine-pseudoephedrine (ALLEGRA-D) 60-120 MG 12 hr tablet Take 1 tablet by mouth 2 (two) times daily. (Patient not taking: Reported on 12/18/2020) 20 tablet 0  ? fluconazole (DIFLUCAN) 150 MG tablet Take 1 tablet (150 mg total) by mouth as directed. Take one tablet by mouth on day 1. May repeat dose of one tablet by mouth on day four. (Patient not taking: Reported on 12/18/2020) 2 tablet 0  ? lidocaine (XYLOCAINE) 2 % jelly 1 application by Other route 3 (three) times daily. (Patient not taking: Reported on 12/18/2020) 30 mL 1  ? neomycin-polymyxin-hydrocortisone (CORTISPORIN) 3.5-10000-1 OTIC suspension Place 3 drops into the right ear 4 (four) times daily. (Patient not taking: Reported on 12/18/2020) 10 mL 0  ? Nitroglycerin (RECTIV) 0.4 % OINT Place 1 application rectally 2 (two) times daily for 7 days. 30 g  0  ? pantoprazole (PROTONIX) 40 MG tablet Take 1 tablet (40 mg total) by mouth daily. 90 tablet 1  ? Semaglutide-Weight Management (WEGOVY) 0.25 MG/0.5ML SOAJ Inject 0.25 mg into the skin once a week. 1 mL 0  ? Vitamin D, Ergocalciferol, (DRISDOL) 1.25 MG (50000 UNIT) CAPS capsule TAKE 1 CAPSULE BY MOUTH EVERY 7 DAYS(TAKING 1 CAPSULE PER WEEK) WALK IN LAB IN OFFICE 1- 2 WEEKS AFTER COMPLETING PRESCRIPTION 12 capsule 0  ? ?No facility-administered medications prior to visit.  ? ? ?No Known Allergies ? ?ROS ?Review of Systems ? ?  ?Objective:  ?  ?Physical Exam ? ?There were no vitals taken for this visit. ?Wt Readings from Last 3 Encounters:  ?09/15/21 (!) 325 lb 6.4 oz (147.6 kg)  ?12/18/20 (!) 325 lb 6.4 oz (147.6 kg)  ?11/13/20 (!) 333 lb 3.2 oz (151.1 kg)  ? ? ? ?Health Maintenance Due  ?Topic Date Due  ? HIV Screening  Never done  ? Hepatitis C  Screening  Never done  ? TETANUS/TDAP  Never done  ? COLONOSCOPY (Pts 45-16yrs Insurance coverage will need to be confirmed)  Never done  ? COVID-19 Vaccine (4 - Booster for Moderna series) 12/15/2020  ? INFLUENZA VACCINE  Never done  ? ? ?There are no preventive care reminders to display for this patient. ? ?Lab Results  ?Component Value Date  ? TSH 0.873 11/13/2020  ? ?Lab Results  ?Component Value Date  ? WBC 8.8 09/15/2021  ? HGB 8.0 (L) 09/15/2021  ? HCT 27.2 (L) 09/15/2021  ? MCV 63.3 (L) 09/15/2021  ? PLT 347 09/15/2021  ? ?Lab Results  ?Component Value Date  ? NA 136 09/15/2021  ? K 3.4 (L) 09/15/2021  ? CO2 24 09/15/2021  ? GLUCOSE 96 09/15/2021  ? BUN 8 09/15/2021  ? CREATININE 0.61 09/15/2021  ? BILITOT 0.2 11/13/2020  ? ALKPHOS 93 11/13/2020  ? AST 13 11/13/2020  ? ALT 13 11/13/2020  ? PROT 7.1 11/13/2020  ? ALBUMIN 4.0 11/13/2020  ? CALCIUM 8.8 (L) 09/15/2021  ? ANIONGAP 7 09/15/2021  ? ?Lab Results  ?Component Value Date  ? CHOL 253 (H) 11/13/2020  ? ?Lab Results  ?Component Value Date  ? HDL 60 11/13/2020  ? ?Lab Results  ?Component Value Date  ? LDLCALC 166 (H) 11/13/2020  ? ?Lab Results  ?Component Value Date  ? TRIG 151 (H) 11/13/2020  ? ?Lab Results  ?Component Value Date  ? CHOLHDL 4.2 11/13/2020  ? ?Lab Results  ?Component Value Date  ? HGBA1C 5.6 11/13/2020  ? ? ?  ?Assessment & Plan:  ? ?Problem List Items Addressed This Visit   ?None ? ? ?No orders of the defined types were placed in this encounter. ? ? ?Follow-up: No follow-ups on file.  ? ? ?Beverlee Nims, [place] ?

## 2022-02-11 ENCOUNTER — Ambulatory Visit: Payer: Self-pay | Admitting: Physician Assistant

## 2022-02-12 ENCOUNTER — Emergency Department: Payer: Self-pay

## 2022-02-12 ENCOUNTER — Emergency Department
Admission: EM | Admit: 2022-02-12 | Discharge: 2022-02-12 | Disposition: A | Discharge status: Home / Self Care | Payer: Self-pay | Attending: Emergency Medicine | Admitting: Emergency Medicine

## 2022-02-12 ENCOUNTER — Other Ambulatory Visit: Payer: Self-pay

## 2022-02-12 DIAGNOSIS — Z20822 Contact with and (suspected) exposure to covid-19: Secondary | ICD-10-CM | POA: Insufficient documentation

## 2022-02-12 DIAGNOSIS — J209 Acute bronchitis, unspecified: Secondary | ICD-10-CM | POA: Insufficient documentation

## 2022-02-12 LAB — RESP PANEL BY RT-PCR (FLU A&B, COVID) ARPGX2
Influenza A by PCR: NEGATIVE
Influenza B by PCR: NEGATIVE
SARS Coronavirus 2 by RT PCR: NEGATIVE

## 2022-02-12 MED ORDER — CIPROFLOXACIN HCL 0.3 % OP SOLN: 1.0000 [drp] | Freq: Four times a day (QID) | OPHTHALMIC | 0 refills | Status: DC

## 2022-02-12 MED ORDER — ALBUTEROL SULFATE HFA 108 (90 BASE) MCG/ACT IN AERS: 2.0000 | INHALATION_SPRAY | RESPIRATORY_TRACT | 1 refills | Status: DC | PRN

## 2022-02-12 MED ORDER — ALBUTEROL SULFATE HFA 108 (90 BASE) MCG/ACT IN AERS: 2.0000 | INHALATION_SPRAY | RESPIRATORY_TRACT | 1 refills | Status: AC | PRN

## 2022-02-12 MED ORDER — KETOROLAC TROMETHAMINE 0.4 % OP SOLN: 1.0000 [drp] | Freq: Four times a day (QID) | OPHTHALMIC | 0 refills | Status: DC

## 2022-02-12 MED ORDER — IPRATROPIUM-ALBUTEROL 0.5-2.5 (3) MG/3ML IN SOLN
3.0000 mL | Freq: Once | RESPIRATORY_TRACT | Status: AC
  Administered 2022-02-12: 3 mL via RESPIRATORY_TRACT
  Filled 2022-02-12: qty 3

## 2022-02-12 MED ORDER — GUAIFENESIN-CODEINE 100-10 MG/5ML PO SYRP: 5.0000 mL | ORAL_SOLUTION | Freq: Three times a day (TID) | ORAL | 0 refills | Status: DC | PRN

## 2022-02-12 MED ORDER — PREDNISONE 10 MG PO TABS: 50.0000 mg | ORAL_TABLET | Freq: Every day | ORAL | 0 refills | Status: DC

## 2022-02-12 NOTE — ED Notes (Signed)
COVID swab sent to lab.

## 2022-02-12 NOTE — ED Triage Notes (Signed)
Pt arrived pov with spouse. Wednesday pt woke up and started with a sore throat woke up yesterday with cough and congestion and shob pt used her rescue inhaler with no relief. Pt has hx of asthma and bronchitis and states this feels like one of her flares.  ?

## 2022-02-12 NOTE — ED Provider Notes (Signed)
? ?[facility] ?Provider Note ? ? ? Event Date/Time  ? First MD Initiated Contact with Patient 02/12/22 1200   ?  (approximate) ? ? ?History  ? ?Nasal Congestion ? ? ?HPI ? ?Alexa Osborne is a 46 y.o. female with history of GERD and as listed in EMR  ?  ? ? ?Physical Exam  ? ?Triage Vital Signs: ?ED Triage Vitals  ?Enc Vitals Group  ?   BP 02/12/22 1144 110/81  ?   Pulse Rate 02/12/22 1144 93  ?   Resp 02/12/22 1144 18  ?   Temp 02/12/22 1144 98.3 ?F (36.8 ?C)  ?   Temp Source 02/12/22 1144 Oral  ?   SpO2 02/12/22 1144 95 %  ?   Weight 02/12/22 1143 (!) 310 lb (140.6 kg)  ?   Height 02/12/22 1143 '5\' 3"'$  (1.6 m)  ?   Head Circumference --   ?   Peak Flow --   ?   Pain Score 02/12/22 1152 0  ?   Pain Loc --   ?   Pain Edu? --   ?   Excl. in [place]? --   ? ? ?Most recent vital signs: ?Vitals:  ? 02/12/22 1151 02/12/22 1403  ?BP:    ?Pulse:    ?Resp:    ?Temp:    ?SpO2: 95% 97%  ? ? ?General: Awake, no distress.  ?CV:  Good peripheral perfusion. Expiratory wheeze noted. ?Resp:  Normal effort.  ?Abd:  No distention.  ?Other:   ? ?ED Results / Procedures / Treatments  ? ?Labs ?(all labs ordered are listed, but only abnormal results are displayed) ?Labs Reviewed  ?RESP PANEL BY RT-PCR (FLU A&B, COVID) [id number]  ? ? ? ?EKG ? ?Not indicated ? ? ?RADIOLOGY ? ?Image and radiology report reviewed by me. ? ?Chest x-ray indicates viral process such as bronchitis. ? ?PROCEDURES: ? ?Critical Care performed: No ? ?Procedures ? ? ?MEDICATIONS ORDERED IN ED: ?Medications  ?ipratropium-albuterol (DUONEB) 0.5-2.5 (3) MG/3ML nebulizer solution 3 mL (3 mLs Nebulization Given 02/12/22 1208)  ? ? ? ?IMPRESSION / MDM / ASSESSMENT AND PLAN / ED COURSE  ? ?I have reviewed the triage note. ? ?Differential diagnosis includes, but is not limited to, COVID, influenza, viral syndrome ? ?46 year old female presenting to the emergency department for treatment and evaluation of symptoms as described in the HPI.  COVID and influenza are  both negative.  Chest x-ray does indicate a viral infection such as bronchitis.  Wheezing significantly improved after DuoNeb.  Plan will be to prescribe her a burst dose of prednisone and refill her albuterol inhaler.  She states that the cough keeps her up at night.  Robitussin-AC also prescribed.  She is to follow-up with her primary care provider if not improving over the week.  For symptoms that change or worsen if she is unable to get an earlier appointment she is to return to the emergency department. ? ?Of note, ciprofloxacin eyedrops and Acular mistakenly sent to [facility] pharmacy.  Pharmacy was contacted and prescriptions were discontinued. ? ?  ? ? ?FINAL CLINICAL IMPRESSION(S) / ED DIAGNOSES  ? ?Final diagnoses:  ?Acute bronchitis, unspecified organism  ? ? ? ?Rx / DC Orders  ? ?ED Discharge Orders   ? ?      Ordered  ?  ciprofloxacin (CILOXAN) 0.3 % ophthalmic solution  Every 6 hours,   Status:  Discontinued       ? 02/12/22 1235  ?  ketorolac (ACULAR) 0.4 % SOLN  4 times daily,   Status:  Discontinued       ? 02/12/22 1235  ?  predniSONE (DELTASONE) 10 MG tablet  Daily       ? 02/12/22 1350  ?  albuterol (VENTOLIN HFA) 108 (90 Base) MCG/ACT inhaler  Every 4 hours PRN,   Status:  Discontinued       ? 02/12/22 1350  ?  guaiFENesin-codeine (ROBITUSSIN AC) 100-10 MG/5ML syrup  3 times daily PRN       ? 02/12/22 1350  ?  albuterol (VENTOLIN HFA) 108 (90 Base) MCG/ACT inhaler  Every 4 hours PRN       ? 02/12/22 1423  ? ?  ?  ? ?  ? ? ? ?Note:  This document was prepared using Dragon voice recognition software and may include unintentional dictation errors. ?  ?Victorino Dike, FNP ?02/12/22 1859 ? ?  ?Duffy Bruce, MD ?02/12/22 2205 ? ?

## 2022-02-12 NOTE — Discharge Instructions (Addendum)
Please follow-up with your primary care provider if not improving over the week. ? ?Return to the emergency department for symptoms that change or worsen if you are unable to schedule appointment. ?

## 2022-02-21 ENCOUNTER — Encounter: Payer: Self-pay | Admitting: Physician Assistant

## 2022-02-21 ENCOUNTER — Other Ambulatory Visit: Payer: Self-pay

## 2022-02-21 ENCOUNTER — Ambulatory Visit (INDEPENDENT_AMBULATORY_CARE_PROVIDER_SITE_OTHER): Payer: 59 | Admitting: Physician Assistant

## 2022-02-21 VITALS — BP 118/81 | HR 100 | Ht 63.0 in | Wt 327.7 lb

## 2022-02-21 DIAGNOSIS — Z1211 Encounter for screening for malignant neoplasm of colon: Secondary | ICD-10-CM

## 2022-02-21 DIAGNOSIS — Z862 Personal history of diseases of the blood and blood-forming organs and certain disorders involving the immune mechanism: Secondary | ICD-10-CM | POA: Insufficient documentation

## 2022-02-21 DIAGNOSIS — K219 Gastro-esophageal reflux disease without esophagitis: Secondary | ICD-10-CM

## 2022-02-21 DIAGNOSIS — H0263 Xanthelasma of right eye, unspecified eyelid: Secondary | ICD-10-CM

## 2022-02-21 DIAGNOSIS — Z1231 Encounter for screening mammogram for malignant neoplasm of breast: Secondary | ICD-10-CM

## 2022-02-21 DIAGNOSIS — F322 Major depressive disorder, single episode, severe without psychotic features: Secondary | ICD-10-CM | POA: Insufficient documentation

## 2022-02-21 DIAGNOSIS — F411 Generalized anxiety disorder: Secondary | ICD-10-CM | POA: Diagnosis not present

## 2022-02-21 DIAGNOSIS — H0266 Xanthelasma of left eye, unspecified eyelid: Secondary | ICD-10-CM

## 2022-02-21 MED ORDER — PEG 3350-KCL-NA BICARB-NACL 420 G PO SOLR: 4000.0000 mL | Freq: Once | ORAL | 0 refills | Status: AC

## 2022-02-21 MED ORDER — ESCITALOPRAM OXALATE 10 MG PO TABS: 10.0000 mg | ORAL_TABLET | Freq: Every day | ORAL | 1 refills | Status: DC

## 2022-02-21 MED ORDER — PANTOPRAZOLE SODIUM 40 MG PO TBEC: 40.0000 mg | DELAYED_RELEASE_TABLET | Freq: Every day | ORAL | 1 refills | Status: DC

## 2022-02-21 NOTE — Assessment & Plan Note (Addendum)
Discussed diet, binging, exercise ?Will check lipids, a1c.  ?Would be a good candidate for semaglutide + nutritionist  ?

## 2022-02-21 NOTE — Assessment & Plan Note (Signed)
Will recheck CBC and iron panel ?Pt initially d/c oral iron due to constipation ?Depending on levels may be a better candidate for infusions as historically hgb has been as low as 8  ?Orally she can try reacted iron for improvement w/ constipation ?

## 2022-02-21 NOTE — Assessment & Plan Note (Signed)
Currently uncontrolled, reordered protonix.  ?Advised pt to monitor symptoms, trigger foods ?

## 2022-02-21 NOTE — Progress Notes (Signed)
I,Sha'taria Tyson,acting as a Education administrator for [employer], PA-C.,have documented all relevant documentation on the behalf of Mikey Kirschner, PA-C,as directed by  Mikey Kirschner, PA-C while in the presence of Mikey Kirschner, PA-C.  Established Patient Office Visit  Subjective:  Patient ID: Alexa Osborne, female    DOB: 06/12/1976  Age: 46 y.o. MRN: [id number]  CC: anxiety, weight loss    HPI CHAUNCEY BRUNO presents for follow-up. Anxiety, Follow-up  She was last seen for anxiety 14 months ago. Changes made at last visit include .   She reports poor compliance with treatment. She reports good tolerance of treatment. She is not having side effects.   She feels her anxiety is mild and Worse since last visit. Describes' triggers' that can lead to anxiety or panic attacks.   Symptoms: No chest pain Yes difficulty concentrating  No dizziness Yes fatigue  Yes feelings of losing control Yes insomnia  Yes irritable Yes palpitations  Yes panic attacks Yes racing thoughts  No shortness of breath Yes sweating (at night)  Yes tremors/shakes    GAD-7 Results GAD-7 Generalized Anxiety Disorder Screening Tool 02/21/2022 11/13/2020  1. Feeling Nervous, Anxious, or on Edge 3 3  2. Not Being Able to Stop or Control Worrying 3 3  3. Worrying Too Much About Different Things 3 3  4. Trouble Relaxing 2 2  5. Being So Restless it's Hard To Sit Still 2 2  6. Becoming Easily Annoyed or Irritable 3 3  7. Feeling Afraid As If Something Awful Might Happen 3 3  Total GAD-7 Score 19 19  Difficulty At Work, Home, or Getting  Along With Others? Extremely difficult Very difficult    PHQ-9 Scores PHQ9 SCORE ONLY 02/21/2022 11/13/2020  PHQ-9 Total Score 25 22    GERD --Chronic per pt, feels while she is eating. Does not feel with bland foods or water. Reports burning/pain in her upper abdomen. Symptoms always feel resolved on Protonix.  Overweight  --Could not get wegovy approved. --Reports trying  Keto before with success but cannot stick to it.  --When she is anxious/depressed she binges/overeats.    Past Medical History:  Diagnosis Date   Acid reflux    Allergy    Anxiety    Asthma    Depression     Past Surgical History:  Procedure Laterality Date   DILATION AND CURETTAGE OF UTERUS     TONSILLECTOMY     TUBAL LIGATION      Family History  Problem Relation Age of Onset   Diabetes Mother    Kidney failure Mother    Hypertension Mother    Heart failure Mother    Hypertension Father    Diabetes Father    Gout Father     Social History   Socioeconomic History   Marital status: Single    Spouse name: Not on file   Number of children: Not on file   Years of education: Not on file   Highest education level: Not on file  Occupational History   Not on file  Tobacco Use   Smoking status: Every Day    Packs/day: 1.00    Types: Cigarettes   Smokeless tobacco: Never  Vaping Use   Vaping Use: Never used  Substance and Sexual Activity   Alcohol use: No   Drug use: No   Sexual activity: Yes  Other Topics Concern   Not on file  Social History Narrative   Not on file   Social Determinants  of Health   Financial Resource Strain: Not on file  Food Insecurity: Not on file  Transportation Needs: Not on file  Physical Activity: Not on file  Stress: Not on file  Social Connections: Not on file  Intimate Partner Violence: Not on file    Outpatient Medications Prior to Visit  Medication Sig Dispense Refill   albuterol (VENTOLIN HFA) 108 (90 Base) MCG/ACT inhaler Inhale 2 puffs into the lungs every 4 (four) hours as needed for wheezing or shortness of breath. 1 each 1   docusate sodium (COLACE) 100 MG capsule Take 1 capsule (100 mg total) by mouth 2 (two) times daily. 30 capsule 0   ferrous sulfate (FERROUSUL) 325 (65 FE) MG tablet Take 1 tablet (325 mg total) by mouth daily with breakfast. (Patient not taking: Reported on 02/21/2022) 90 tablet 1   escitalopram  (LEXAPRO) 10 MG tablet Take 1 tablet (10 mg total) by mouth daily. (Patient not taking: Reported on 02/21/2022) 90 tablet 0   fexofenadine-pseudoephedrine (ALLEGRA-D) 60-120 MG 12 hr tablet Take 1 tablet by mouth 2 (two) times daily. (Patient not taking: Reported on 12/18/2020) 20 tablet 0   fluconazole (DIFLUCAN) 150 MG tablet Take 1 tablet (150 mg total) by mouth as directed. Take one tablet by mouth on day 1. May repeat dose of one tablet by mouth on day four. 2 tablet 0   guaiFENesin-codeine (ROBITUSSIN AC) 100-10 MG/5ML syrup Take 5 mLs by mouth 3 (three) times daily as needed for cough. 120 mL 0   lidocaine (XYLOCAINE) 2 % jelly 1 application by Other route 3 (three) times daily. 30 mL 1   Nitroglycerin (RECTIV) 0.4 % OINT Place 1 application rectally 2 (two) times daily for 7 days. 30 g 0   pantoprazole (PROTONIX) 40 MG tablet Take 1 tablet (40 mg total) by mouth daily. 90 tablet 1   predniSONE (DELTASONE) 10 MG tablet Take 5 tablets (50 mg total) by mouth daily. 25 tablet 0   Semaglutide-Weight Management (WEGOVY) 0.25 MG/0.5ML SOAJ Inject 0.25 mg into the skin once a week. 1 mL 0   Vitamin D, Ergocalciferol, (DRISDOL) 1.25 MG (50000 UNIT) CAPS capsule TAKE 1 CAPSULE BY MOUTH EVERY 7 DAYS(TAKING 1 CAPSULE PER WEEK) WALK IN LAB IN OFFICE 1- 2 WEEKS AFTER COMPLETING PRESCRIPTION (Patient not taking: Reported on 02/21/2022) 12 capsule 0   No facility-administered medications prior to visit.    No Known Allergies  ROS Review of Systems  Constitutional:  Negative for fatigue and fever.  Respiratory:  Negative for cough and shortness of breath.   Cardiovascular:  Negative for chest pain and leg swelling.  Gastrointestinal:  Negative for abdominal pain.  Neurological:  Negative for dizziness and headaches.  Psychiatric/Behavioral:  The patient is nervous/anxious.      Objective:    Physical Exam Constitutional:      Appearance: Normal appearance. She is not ill-appearing.  HENT:      Head: Normocephalic.  Eyes:     Conjunctiva/sclera: Conjunctivae normal.  Cardiovascular:     Rate and Rhythm: Normal rate and regular rhythm.     Pulses: Normal pulses.     Heart sounds: Normal heart sounds.  Pulmonary:     Effort: Pulmonary effort is normal.     Breath sounds: Normal breath sounds.  Musculoskeletal:     Right lower leg: No edema.     Left lower leg: No edema.  Neurological:     Mental Status: She is oriented to person, place, and time.  Psychiatric:        Mood and Affect: Mood normal.        Behavior: Behavior normal.    BP 118/81 (BP Location: Left Arm, Patient Position: Sitting, Cuff Size: Large)    Pulse 100    Ht '5\' 3"'$  (1.6 m)    Wt (!) 327 lb 11.2 oz (148.6 kg)    LMP 01/12/2022 (Within Days)    SpO2 100%    BMI 58.05 kg/m  Wt Readings from Last 3 Encounters:  02/21/22 (!) 327 lb 11.2 oz (148.6 kg)  02/12/22 (!) 310 lb (140.6 kg)  09/15/21 (!) 325 lb 6.4 oz (147.6 kg)     Health Maintenance Due  Topic Date Due   HIV Screening  Never done   Hepatitis C Screening  Never done   TETANUS/TDAP  Never done   COLONOSCOPY (Pts 45-15yr Insurance coverage will need to be confirmed)  Never done    There are no preventive care reminders to display for this patient.  Lab Results  Component Value Date   TSH 0.873 11/13/2020   Lab Results  Component Value Date   WBC 8.8 09/15/2021   HGB 8.0 (L) 09/15/2021   HCT 27.2 (L) 09/15/2021   MCV 63.3 (L) 09/15/2021   PLT 347 09/15/2021   Lab Results  Component Value Date   NA 136 09/15/2021   K 3.4 (L) 09/15/2021   CO2 24 09/15/2021   GLUCOSE 96 09/15/2021   BUN 8 09/15/2021   CREATININE 0.61 09/15/2021   BILITOT 0.2 11/13/2020   ALKPHOS 93 11/13/2020   AST 13 11/13/2020   ALT 13 11/13/2020   PROT 7.1 11/13/2020   ALBUMIN 4.0 11/13/2020   CALCIUM 8.8 (L) 09/15/2021   ANIONGAP 7 09/15/2021   Lab Results  Component Value Date   CHOL 253 (H) 11/13/2020   Lab Results  Component Value Date   HDL  60 11/13/2020   Lab Results  Component Value Date   LDLCALC 166 (H) 11/13/2020   Lab Results  Component Value Date   TRIG 151 (H) 11/13/2020   Lab Results  Component Value Date   CHOLHDL 4.2 11/13/2020   Lab Results  Component Value Date   HGBA1C 5.6 11/13/2020      Assessment & Plan:   Problem List Items Addressed This Visit       Digestive   Gastroesophageal reflux disease    Currently uncontrolled, reordered protonix.  Advised pt to monitor symptoms, trigger foods      Relevant Medications   pantoprazole (PROTONIX) 40 MG tablet     Musculoskeletal and Integument   Xanthelasma of eyelid, bilateral    Pt is aware they are due to HLD Will recheck cholesterol not currently on statin.  The 10-year ASCVD risk score (Arnett DK, et al., 2019) is: 2.1%         Other   History of iron deficiency anemia    Will recheck CBC and iron panel Pt initially d/c oral iron due to constipation Depending on levels may be a better candidate for infusions as historically hgb has been as low as 8  Orally she can try reacted iron for improvement w/ constipation      Relevant Orders   Comprehensive Metabolic Panel (CMET)   Iron, TIBC and Ferritin Panel   CBC   Morbid obesity ([place]    Discussed diet, binging, exercise Will check lipids, a1c.  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 Discussed therapy, pt agrees      Relevant Medications   escitalopram (LEXAPRO) 10 MG tablet   Other Relevant Orders   Comprehensive Metabolic Panel (CMET)   Ambulatory referral to Psychology   Depression, major, single episode, severe ([place])   Relevant Medications   escitalopram (LEXAPRO) 10 MG tablet   Other Relevant Orders   Ambulatory referral to Psychology   Other  Visit Diagnoses     Encounter for screening colonoscopy       Relevant Orders   Ambulatory referral to Gastroenterology   Encounter for screening mammogram for breast cancer       Relevant Orders   MM 3D SCREEN BREAST BILATERAL        Follow-up: Return in about 6 weeks (around 04/04/2022) for depression, anxiety.    I, Mikey Kirschner, PA-C have reviewed all documentation for this visit. The documentation on  02/21/2022 for the exam, diagnosis, procedures, and orders are all accurate and complete.   Mikey Kirschner, PA-C [facility] [address] [place], [place], [address] Office: [phone] Fax: [phone]

## 2022-02-21 NOTE — Patient Instructions (Addendum)
Reacted Iron  ?-[facility] - Reacted Iron  ?29 mg capsules once daily.  ?

## 2022-02-21 NOTE — Assessment & Plan Note (Signed)
Pt is aware they are due to HLD ?Will recheck cholesterol not currently on statin.  ?The 10-year ASCVD risk score (Arnett DK, et al., 2019) is: 2.1% ? ?

## 2022-02-21 NOTE — Assessment & Plan Note (Signed)
Mixed with depressive d/o  ?Restarted lexapro 10 mg, we will f/u 6 mo, may need to increase to 20 mg. ?Discussed therapy, pt agrees ?

## 2022-02-21 NOTE — Progress Notes (Signed)
Gastroenterology Pre-Procedure Review ? ?Request Date: 04/18/2022 ?Requesting Physician: Dr. Marius Ditch ? ?PATIENT REVIEW QUESTIONS: The patient responded to the following health history questions as indicated:   ? ?1. Are you having any GI issues? no ?2. Do you have a personal history of Polyps? no ?3. Do you have a family history of Colon Cancer or Polyps? no ?4. Diabetes Mellitus? no ?5. Joint replacements in the past 12 months?no ?6. Major health problems in the past 3 months?no ?7. Any artificial heart valves, MVP, or defibrillator?no ?   ?MEDICATIONS & ALLERGIES:    ?Patient reports the following regarding taking any anticoagulation/antiplatelet therapy:   ?Plavix, Coumadin, Eliquis, Xarelto, Lovenox, Pradaxa, Brilinta, or Effient? no ?Aspirin? no ? ?Patient confirms/reports the following medications:  ?Current Outpatient Medications  ?Medication Sig Dispense Refill  ? albuterol (VENTOLIN HFA) 108 (90 Base) MCG/ACT inhaler Inhale 2 puffs into the lungs every 4 (four) hours as needed for wheezing or shortness of breath. 1 each 1  ? docusate sodium (COLACE) 100 MG capsule Take 1 capsule (100 mg total) by mouth 2 (two) times daily. 30 capsule 0  ? escitalopram (LEXAPRO) 10 MG tablet Take 1 tablet (10 mg total) by mouth daily. 90 tablet 1  ? ferrous sulfate (FERROUSUL) 325 (65 FE) MG tablet Take 1 tablet (325 mg total) by mouth daily with breakfast. (Patient not taking: Reported on 02/21/2022) 90 tablet 1  ? pantoprazole (PROTONIX) 40 MG tablet Take 1 tablet (40 mg total) by mouth daily. 90 tablet 1  ? ?No current facility-administered medications for this visit.  ? ? ?Patient confirms/reports the following allergies:  ?No Known Allergies ? ?No orders of the defined types were placed in this encounter. ? ? ?AUTHORIZATION INFORMATION ?Primary Insurance: ?1D#: ?Group #: ? ?Secondary Insurance: ?1D#: ?Group #: ? ?SCHEDULE INFORMATION: ?Date: 04/18/2022 ?Time: ?Location: [facility] ? ?

## 2022-02-22 ENCOUNTER — Other Ambulatory Visit: Payer: Self-pay | Admitting: Physician Assistant

## 2022-02-22 DIAGNOSIS — E782 Mixed hyperlipidemia: Secondary | ICD-10-CM

## 2022-02-22 DIAGNOSIS — D509 Iron deficiency anemia, unspecified: Secondary | ICD-10-CM

## 2022-02-22 DIAGNOSIS — R7303 Prediabetes: Secondary | ICD-10-CM

## 2022-02-22 LAB — COMPREHENSIVE METABOLIC PANEL
ALT: 11 IU/L (ref 0–32)
AST: 15 IU/L (ref 0–40)
Albumin/Globulin Ratio: 1.1 — ABNORMAL LOW (ref 1.2–2.2)
Albumin: 4.2 g/dL (ref 3.8–4.8)
Alkaline Phosphatase: 105 IU/L (ref 44–121)
BUN/Creatinine Ratio: 12 (ref 9–23)
BUN: 9 mg/dL (ref 6–24)
Bilirubin Total: 0.3 mg/dL (ref 0.0–1.2)
CO2: 22 mmol/L (ref 20–29)
Calcium: 9.8 mg/dL (ref 8.7–10.2)
Chloride: 101 mmol/L (ref 96–106)
Creatinine, Ser: 0.75 mg/dL (ref 0.57–1.00)
Globulin, Total: 3.7 g/dL (ref 1.5–4.5)
Glucose: 88 mg/dL (ref 70–99)
Potassium: 4.8 mmol/L (ref 3.5–5.2)
Sodium: 137 mmol/L (ref 134–144)
Total Protein: 7.9 g/dL (ref 6.0–8.5)
eGFR: 99 mL/min/{1.73_m2} (ref >59)

## 2022-02-22 LAB — CBC
Hematocrit: 33.2 % — ABNORMAL LOW (ref 34.0–46.6)
Hemoglobin: 9.3 g/dL — ABNORMAL LOW (ref 11.1–15.9)
MCH: 16.7 pg — ABNORMAL LOW (ref 26.6–33.0)
MCHC: 28 g/dL — ABNORMAL LOW (ref 31.5–35.7)
MCV: 60 fL — ABNORMAL LOW (ref 79–97)
Platelets: 420 10*3/uL (ref 150–450)
RBC: 5.57 x10E6/uL — ABNORMAL HIGH (ref 3.77–5.28)
RDW: 20.2 % — ABNORMAL HIGH (ref 11.7–15.4)
WBC: 12.4 10*3/uL — ABNORMAL HIGH (ref 3.4–10.8)

## 2022-02-22 LAB — LIPID PANEL
Chol/HDL Ratio: 4.7 ratio — ABNORMAL HIGH (ref 0.0–4.4)
Cholesterol, Total: 261 mg/dL — ABNORMAL HIGH (ref 100–199)
HDL: 56 mg/dL (ref >39)
LDL Chol Calc (NIH): 170 mg/dL — ABNORMAL HIGH (ref 0–99)
Triglycerides: 189 mg/dL — ABNORMAL HIGH (ref 0–149)
VLDL Cholesterol Cal: 35 mg/dL (ref 5–40)

## 2022-02-22 LAB — HEMOGLOBIN A1C
Est. average glucose Bld gHb Est-mCnc: 120 mg/dL
Hgb A1c MFr Bld: 5.8 % — ABNORMAL HIGH (ref 4.8–5.6)

## 2022-02-22 LAB — IRON,TIBC AND FERRITIN PANEL
Ferritin: 5 ng/mL — ABNORMAL LOW (ref 15–150)
Iron Saturation: 4 % — CL (ref 15–55)
Iron: 21 ug/dL — ABNORMAL LOW (ref 27–159)
Total Iron Binding Capacity: 470 ug/dL — ABNORMAL HIGH (ref 250–450)
UIBC: 449 ug/dL — ABNORMAL HIGH (ref 131–425)

## 2022-02-22 MED ORDER — WEGOVY 0.25 MG/0.5ML ~~LOC~~ SOAJ: 0.2500 mg | SUBCUTANEOUS | 0 refills | Status: DC

## 2022-02-22 MED ORDER — WEGOVY 0.5 MG/0.5ML ~~LOC~~ SOAJ: 0.5000 mg | SUBCUTANEOUS | 2 refills | Status: DC

## 2022-02-23 ENCOUNTER — Encounter: Payer: Self-pay | Admitting: Physician Assistant

## 2022-02-24 ENCOUNTER — Encounter: Payer: Self-pay | Admitting: Physician Assistant

## 2022-02-25 ENCOUNTER — Encounter: Payer: Self-pay | Admitting: *Deleted

## 2022-02-28 ENCOUNTER — Other Ambulatory Visit: Payer: Self-pay

## 2022-02-28 ENCOUNTER — Ambulatory Visit (HOSPITAL_COMMUNITY): Payer: 59 | Admitting: Psychiatry

## 2022-03-07 ENCOUNTER — Encounter: Payer: Self-pay | Admitting: Oncology

## 2022-03-07 ENCOUNTER — Other Ambulatory Visit: Payer: Self-pay

## 2022-03-07 ENCOUNTER — Inpatient Hospital Stay: Payer: 59

## 2022-03-07 ENCOUNTER — Inpatient Hospital Stay: Payer: 59 | Attending: Oncology | Admitting: Oncology

## 2022-03-07 VITALS — BP 133/62 | HR 85 | Temp 97.2°F | Resp 18 | Ht 66.25 in | Wt 331.2 lb

## 2022-03-07 DIAGNOSIS — F17210 Nicotine dependence, cigarettes, uncomplicated: Secondary | ICD-10-CM | POA: Diagnosis not present

## 2022-03-07 DIAGNOSIS — D509 Iron deficiency anemia, unspecified: Secondary | ICD-10-CM | POA: Insufficient documentation

## 2022-03-07 DIAGNOSIS — N920 Excessive and frequent menstruation with regular cycle: Secondary | ICD-10-CM | POA: Insufficient documentation

## 2022-03-07 DIAGNOSIS — R7303 Prediabetes: Secondary | ICD-10-CM | POA: Diagnosis not present

## 2022-03-07 DIAGNOSIS — E785 Hyperlipidemia, unspecified: Secondary | ICD-10-CM

## 2022-03-07 DIAGNOSIS — Z79899 Other long term (current) drug therapy: Secondary | ICD-10-CM | POA: Diagnosis not present

## 2022-03-07 NOTE — Progress Notes (Signed)
? ?Hematology/Oncology Consult note ?[facility] ?[phone] [phone] ? ?Patient Care Team: ?Emelia Loron as PCP - General (Physician Assistant) ?Sindy Guadeloupe, MD as Consulting Physician (Hematology and Oncology) ?Lin Landsman, MD as Consulting Physician (Gastroenterology)  ? ?Name of the patient: Alexa Osborne  ?[id number]  ?[birth year]  ? ? ?Reason for referral-iron deficiency anemia ?  ?Referring physician-Lindsey Drubel, PA ? ?Date of visit: 03/07/22 ? ? ?History of presenting illness-patient is a 46 year old African-American female with a past medical history significant for obesity, hyperlipidemia, GERD who has been referred for iron deficiency anemia.  Her most recent CBC from 3/13/2023Showed an H&H of 9.3/33.2 with an MCV of 60.  Of note her hemoglobin has been between 8-9 at least for the last 1 year and evidence of microcytic anemia.  Ferritin levels low at 5 with an iron saturation of 4% and elevated TIBC suggestive of iron deficiency.  Patient notes that her menstrual cycles last for about 5 to 7 days and day 2 to day 4 can be particularly heavy when she goes through a box and a half of tampons each time.  She has not seen GYN for this.  She cannot tolerate oral iron due to constipation.  Denies any family history of colon cancer.  Denies any consistent use of NSAIDs.  Denies any blood loss in her stool or melena.  She is scheduled to undergo colonoscopy in May 2023. ? ?ECOG PS- 0 ? ?Pain scale- 0 ? ? ?Review of systems- Review of Systems  ?Constitutional:  Positive for malaise/fatigue. Negative for chills, fever and weight loss.  ?HENT:  Negative for congestion, ear discharge and nosebleeds.   ?Eyes:  Negative for blurred vision.  ?Respiratory:  Negative for cough, hemoptysis, sputum production, shortness of breath and wheezing.   ?Cardiovascular:  Negative for chest pain, palpitations, orthopnea and claudication.  ?Gastrointestinal:  Negative  for abdominal pain, blood in stool, constipation, diarrhea, heartburn, melena, nausea and vomiting.  ?Genitourinary:  Negative for dysuria, flank pain, frequency, hematuria and urgency.  ?Musculoskeletal:  Negative for back pain, joint pain and myalgias.  ?Skin:  Negative for rash.  ?Neurological:  Negative for dizziness, tingling, focal weakness, seizures, weakness and headaches.  ?Endo/Heme/Allergies:  Does not bruise/bleed easily.  ?Psychiatric/Behavioral:  Negative for depression and suicidal ideas. The patient does not have insomnia.   ? ?No Known Allergies ? ?Patient Active Problem List  ? Diagnosis Date Noted  ? Hyperlipidemia 02/24/2022  ? Prediabetes 02/24/2022  ? History of iron deficiency anemia 02/21/2022  ? Morbid obesity ([place]) 02/21/2022  ? Xanthelasma of eyelid, bilateral 02/21/2022  ? GAD (generalized anxiety disorder) 02/21/2022  ? Depression, major, single episode, severe ([place]) 02/21/2022  ? Weight gain 12/27/2020  ? Iron deficiency anemia 12/27/2020  ? Chronic pain of left ankle 11/15/2020  ? Acute left ankle pain 11/15/2020  ? Screening for colon cancer 11/13/2020  ? Vitamin D insufficiency 11/13/2020  ? Left knee pain 11/13/2020  ? Gastroesophageal reflux disease 11/13/2020  ? Mild intermittent asthma without complication 54/62/7035  ? ? ? ?Past Medical History:  ?Diagnosis Date  ? Acid reflux   ? Allergy   ? Anxiety   ? Asthma   ? Depression   ? IDA (iron deficiency anemia)   ? ? ? ?Past Surgical History:  ?Procedure Laterality Date  ? DILATION AND CURETTAGE OF UTERUS    ? TONSILLECTOMY    ? TUBAL LIGATION    ? ? ?Social History  ? ?Socioeconomic  History  ? Marital status: Single  ?  Spouse name: Not on file  ? Number of children: Not on file  ? Years of education: Not on file  ? Highest education level: Not on file  ?Occupational History  ? Not on file  ?Tobacco Use  ? Smoking status: Every Day  ?  Packs/day: 1.00  ?  Types: Cigarettes  ? Smokeless tobacco: Never  ?Vaping Use  ? Vaping Use:  Never used  ?Substance and Sexual Activity  ? Alcohol use: No  ? Drug use: No  ? Sexual activity: Yes  ?Other Topics Concern  ? Not on file  ?Social History Narrative  ? Not on file  ? ?Social Determinants of Health  ? ?Financial Resource Strain: Not on file  ?Food Insecurity: Not on file  ?Transportation Needs: Not on file  ?Physical Activity: Not on file  ?Stress: Not on file  ?Social Connections: Not on file  ?Intimate Partner Violence: Not on file  ? ?  ?Family History  ?Problem Relation Age of Onset  ? Diabetes Mother   ? Kidney failure Mother   ? Hypertension Mother   ? Heart failure Mother   ? Hypertension Father   ? Diabetes Father   ? Gout Father   ? ? ? ?Current Outpatient Medications:  ?  albuterol (VENTOLIN HFA) 108 (90 Base) MCG/ACT inhaler, Inhale 2 puffs into the lungs every 4 (four) hours as needed for wheezing or shortness of breath., Disp: 1 each, Rfl: 1 ?  escitalopram (LEXAPRO) 10 MG tablet, Take 1 tablet (10 mg total) by mouth daily., Disp: 90 tablet, Rfl: 1 ?  pantoprazole (PROTONIX) 40 MG tablet, Take 1 tablet (40 mg total) by mouth daily., Disp: 90 tablet, Rfl: 1 ?  docusate sodium (COLACE) 100 MG capsule, Take 1 capsule (100 mg total) by mouth 2 (two) times daily. (Patient not taking: Reported on 03/07/2022), Disp: 30 capsule, Rfl: 0 ?  polyethylene glycol-electrolytes (NULYTELY) 420 g solution, See admin instructions. (Patient not taking: Reported on 03/07/2022), Disp: , Rfl:  ?  Semaglutide-Weight Management (WEGOVY) 0.25 MG/0.5ML SOAJ, Inject 0.25 mg into the skin once a week. Then increase to 0.5 mg once weekly. (Patient not taking: Reported on 03/07/2022), Disp: 2 mL, Rfl: 0 ?  Semaglutide-Weight Management (WEGOVY) 0.5 MG/0.5ML SOAJ, Inject 0.5 mg into the skin once a week. Only start after 4 weeks of the 0.25 mg dose. (Patient not taking: Reported on 03/07/2022), Disp: 2 mL, Rfl: 2 ? ? ?Physical exam:  ?Vitals:  ? 03/07/22 1053  ?BP: 133/62  ?Pulse: 85  ?Resp: 18  ?Temp: (!) 97.2 ?F (36.2  ?C)  ?SpO2: 92%  ?Weight: (!) 331 lb 3.2 oz (150.2 kg)  ?Height: 5' 6.25" (1.683 m)  ? ?Physical Exam ?Constitutional:   ?   General: She is not in acute distress. ?   Appearance: She is obese.  ?Cardiovascular:  ?   Rate and Rhythm: Normal rate and regular rhythm.  ?   Heart sounds: Normal heart sounds.  ?Pulmonary:  ?   Effort: Pulmonary effort is normal.  ?   Breath sounds: Normal breath sounds.  ?Abdominal:  ?   General: Bowel sounds are normal.  ?   Palpations: Abdomen is soft.  ?Skin: ?   General: Skin is warm and dry.  ?Neurological:  ?   Mental Status: She is alert and oriented to person, place, and time.  ?  ? ? ? ? ?  Latest Ref Rng & Units 02/21/2022  ?  3:30 PM  ?CMP  ?Glucose 70 - 99 mg/dL 88    ?BUN 6 - 24 mg/dL 9    ?Creatinine 0.57 - 1.00 mg/dL 0.75    ?Sodium 134 - 144 mmol/L 137    ?Potassium 3.5 - 5.2 mmol/L 4.8    ?Chloride 96 - 106 mmol/L 101    ?CO2 20 - 29 mmol/L 22    ?Calcium 8.7 - 10.2 mg/dL 9.8    ?Total Protein 6.0 - 8.5 g/dL 7.9    ?Total Bilirubin 0.0 - 1.2 mg/dL 0.3    ?Alkaline Phos 44 - 121 IU/L 105    ?AST 0 - 40 IU/L 15    ?ALT 0 - 32 IU/L 11    ? ? ?  Latest Ref Rng & Units 02/21/2022  ?  3:30 PM  ?CBC  ?WBC 3.4 - 10.8 x10E3/uL 12.4    ?Hemoglobin 11.1 - 15.9 g/dL 9.3    ?Hematocrit 34.0 - 46.6 % 33.2    ?Platelets 150 - 450 x10E3/uL 420    ? ? ?No images are attached to the encounter. ? ?DG Chest 1 View ? ?Result Date: 02/12/2022 ?CLINICAL DATA:  Cough, wheezing, sore throat. EXAM: CHEST  1 VIEW COMPARISON:  CT chest dated September 15, 2021 FINDINGS: The heart size and mediastinal contours are within normal limits. Prominent bronchovascular markings, which may be seen in viral bronchitis. No focal consolidation or large pleural effusion. The visualized skeletal structures are unremarkable. IMPRESSION: Prominent bronchovascular markings, which may be seen in viral bronchitis. No focal consolidation or pleural effusion. Electronically Signed   By: Keane Police D.O.   On: 02/12/2022 12:29    ? ?Assessment and plan- Patient is a 46 y.o. female referred for iron deficiency anemia possibly secondary to menorrhagia ? ?Given her history of iron deficiency anemia is likely secondary to [employer]

## 2022-03-08 ENCOUNTER — Other Ambulatory Visit: Payer: Self-pay | Admitting: Physician Assistant

## 2022-03-08 ENCOUNTER — Other Ambulatory Visit: Payer: Self-pay

## 2022-03-08 DIAGNOSIS — N922 Excessive menstruation at puberty: Secondary | ICD-10-CM

## 2022-03-08 DIAGNOSIS — N924 Excessive bleeding in the premenopausal period: Secondary | ICD-10-CM

## 2022-03-08 DIAGNOSIS — R7303 Prediabetes: Secondary | ICD-10-CM

## 2022-03-08 MED ORDER — SEMAGLUTIDE(0.25 OR 0.5MG/DOS) 2 MG/1.5ML ~~LOC~~ SOPN
0.2500 mg | PEN_INJECTOR | SUBCUTANEOUS | 2 refills | Status: DC
Start: 2022-03-08 — End: 2022-05-02

## 2022-03-09 ENCOUNTER — Telehealth: Payer: Self-pay

## 2022-03-09 NOTE — Telephone Encounter (Signed)
Referral to GYN for menorrhagia has been sent to [phone] and recieved a fax conirmation.  ?

## 2022-03-14 ENCOUNTER — Inpatient Hospital Stay: Payer: 59 | Attending: Oncology

## 2022-03-14 ENCOUNTER — Ambulatory Visit: Payer: 59

## 2022-03-14 VITALS — BP 117/60 | HR 74 | Temp 97.0°F | Resp 18

## 2022-03-14 DIAGNOSIS — D508 Other iron deficiency anemias: Secondary | ICD-10-CM

## 2022-03-14 DIAGNOSIS — D509 Iron deficiency anemia, unspecified: Secondary | ICD-10-CM | POA: Insufficient documentation

## 2022-03-14 MED ORDER — SODIUM CHLORIDE 0.9 % IV SOLN
510.0000 mg | INTRAVENOUS | Status: DC
  Administered 2022-03-14: 510 mg via INTRAVENOUS
  Filled 2022-03-14: qty 510

## 2022-03-14 MED ORDER — SODIUM CHLORIDE 0.9 % IV SOLN
Freq: Once | INTRAVENOUS | Status: AC
  Filled 2022-03-14: qty 250

## 2022-03-14 NOTE — Patient Instructions (Addendum)

## 2022-03-16 ENCOUNTER — Telehealth: Payer: Self-pay | Admitting: *Deleted

## 2022-03-16 NOTE — Telephone Encounter (Signed)
Patient called reporting that she just does not feel right since yesterday. She had her first iron infusion Monday and felt fine afterwards, but yesterday evening she became very tired, slept and laid around all evening and then felt she could not go to work this morning when she got up and was getting ready. She has her next treatment 4/10. She is asking if anything can be done for her. Please advise ?

## 2022-03-16 NOTE — Telephone Encounter (Signed)
We can push her next feraheme out to 03/28/22. Would not recommend any medical management at this time. See how symptoms evolve. Alexa Osborne please make a f/u phone call in 2 days

## 2022-03-18 NOTE — Telephone Encounter (Signed)
Reached out to pt for a f/u call from Wednesday (03/16/22)pt  was not feeling well after her first infusion. Pt did not answer and VM is not set-up. Will reach back out later on today and check on pts sxs. ?

## 2022-03-18 NOTE — Telephone Encounter (Signed)
Reached out to pt to check on her sx since not feeling well on Wednesday after her first iron infusion. Pt stated she has been feeling much better since Wednesday and was grateful for the call. Informed pt that if she feels that way again to please call the clinic.  ?

## 2022-03-21 ENCOUNTER — Ambulatory Visit: Payer: 59

## 2022-03-28 ENCOUNTER — Inpatient Hospital Stay: Payer: 59

## 2022-03-28 VITALS — BP 125/74 | HR 85 | Temp 97.4°F

## 2022-03-28 DIAGNOSIS — D508 Other iron deficiency anemias: Secondary | ICD-10-CM

## 2022-03-28 DIAGNOSIS — D509 Iron deficiency anemia, unspecified: Secondary | ICD-10-CM | POA: Diagnosis not present

## 2022-03-28 MED ORDER — SODIUM CHLORIDE 0.9 % IV SOLN
510.0000 mg | INTRAVENOUS | Status: DC
  Administered 2022-03-28: 510 mg via INTRAVENOUS
  Filled 2022-03-28: qty 510

## 2022-03-28 MED ORDER — SODIUM CHLORIDE 0.9 % IV SOLN
Freq: Once | INTRAVENOUS | Status: AC
  Filled 2022-03-28: qty 250

## 2022-04-07 ENCOUNTER — Other Ambulatory Visit: Payer: Self-pay | Admitting: Physician Assistant

## 2022-04-07 DIAGNOSIS — R7303 Prediabetes: Secondary | ICD-10-CM

## 2022-04-07 NOTE — Progress Notes (Signed)
Ref med weight manage ?

## 2022-04-15 ENCOUNTER — Inpatient Hospital Stay: Payer: 59 | Attending: Oncology

## 2022-04-15 DIAGNOSIS — D509 Iron deficiency anemia, unspecified: Secondary | ICD-10-CM | POA: Insufficient documentation

## 2022-04-15 LAB — CBC WITH DIFFERENTIAL/PLATELET
Abs Immature Granulocytes: 0.04 10*3/uL (ref 0.00–0.07)
Basophils Absolute: 0 10*3/uL (ref 0.0–0.1)
Basophils Relative: 0 %
Eosinophils Absolute: 0.1 10*3/uL (ref 0.0–0.5)
Eosinophils Relative: 1 %
HCT: 36 % (ref 36.0–46.0)
Hemoglobin: 10.6 g/dL — ABNORMAL LOW (ref 12.0–15.0)
Immature Granulocytes: 0 %
Lymphocytes Relative: 24 %
Lymphs Abs: 2.8 10*3/uL (ref 0.7–4.0)
MCH: 20.4 pg — ABNORMAL LOW (ref 26.0–34.0)
MCHC: 29.4 g/dL — ABNORMAL LOW (ref 30.0–36.0)
MCV: 69.2 fL — ABNORMAL LOW (ref 80.0–100.0)
Monocytes Absolute: 0.6 10*3/uL (ref 0.1–1.0)
Monocytes Relative: 5 %
Neutro Abs: 8 10*3/uL — ABNORMAL HIGH (ref 1.7–7.7)
Neutrophils Relative %: 70 %
Platelets: 274 10*3/uL (ref 150–400)
RBC: 5.2 MIL/uL — ABNORMAL HIGH (ref 3.87–5.11)
RDW: 31.5 % — ABNORMAL HIGH (ref 11.5–15.5)
WBC: 11.6 10*3/uL — ABNORMAL HIGH (ref 4.0–10.5)
nRBC: 0 % (ref 0.0–0.2)

## 2022-04-15 LAB — FERRITIN: Ferritin: 37 ng/mL (ref 11–307)

## 2022-04-15 LAB — FOLATE: Folate: 8.5 ng/mL (ref >5.9)

## 2022-04-15 LAB — IRON AND TIBC
Iron: 29 ug/dL (ref 28–170)
Saturation Ratios: 8 % — ABNORMAL LOW (ref 10.4–31.8)
TIBC: 361 ug/dL (ref 250–450)
UIBC: 332 ug/dL

## 2022-04-15 LAB — VITAMIN B12: Vitamin B-12: 367 pg/mL (ref 180–914)

## 2022-04-18 ENCOUNTER — Ambulatory Visit
Admission: RE | Admit: 2022-04-18 | Discharge: 2022-04-18 | Disposition: A | Discharge status: Home / Self Care | Payer: 59 | Source: Ambulatory Visit | Attending: Gastroenterology | Admitting: Gastroenterology

## 2022-04-18 ENCOUNTER — Ambulatory Visit: Payer: 59 | Admitting: Anesthesiology

## 2022-04-18 ENCOUNTER — Encounter: Payer: Self-pay | Admitting: Gastroenterology

## 2022-04-18 ENCOUNTER — Encounter
Admission: RE | Disposition: A | Discharge status: Home / Self Care | Payer: Self-pay | Source: Ambulatory Visit | Attending: Gastroenterology

## 2022-04-18 ENCOUNTER — Inpatient Hospital Stay: Payer: 59

## 2022-04-18 ENCOUNTER — Ambulatory Visit: Payer: 59 | Admitting: Physician Assistant

## 2022-04-18 DIAGNOSIS — Z1211 Encounter for screening for malignant neoplasm of colon: Secondary | ICD-10-CM | POA: Insufficient documentation

## 2022-04-18 DIAGNOSIS — K219 Gastro-esophageal reflux disease without esophagitis: Secondary | ICD-10-CM | POA: Diagnosis not present

## 2022-04-18 HISTORY — DX: Morbid (severe) obesity due to excess calories: E66.01

## 2022-04-18 HISTORY — DX: Body Mass Index (BMI) 40.0 and over, adult: Z684

## 2022-04-18 HISTORY — PX: COLONOSCOPY WITH PROPOFOL: SHX5780

## 2022-04-18 LAB — POCT PREGNANCY, URINE: Preg Test, Ur: NEGATIVE

## 2022-04-18 SURGERY — COLONOSCOPY WITH PROPOFOL
Anesthesia: General

## 2022-04-18 MED ORDER — SODIUM CHLORIDE 0.9 % IV SOLN: INTRAVENOUS | Status: DC

## 2022-04-18 MED ORDER — PROPOFOL 10 MG/ML IV BOLUS
INTRAVENOUS | Status: DC | PRN
Start: 2022-04-18 — End: 2022-04-18
  Administered 2022-04-18: 70 mg via INTRAVENOUS
  Administered 2022-04-18: 110 ug/kg/min via INTRAVENOUS

## 2022-04-18 MED ORDER — STERILE WATER FOR IRRIGATION IR SOLN
Status: DC | PRN
  Administered 2022-04-18: 60 mL

## 2022-04-18 NOTE — Op Note (Signed)
[facility] ?Gastroenterology ?Patient Name: Alexa Osborne ?Procedure Date: 04/18/2022 10:44 AM ?MRN: [id number] ?Account #: [id number] ?Date of Birth: 10-Aug-1976 ?Admit Type: Outpatient ?Age: 46 ?Room: [facility] ENDO ROOM 4 ?Gender: Female ?Note Status: Finalized ?Instrument Name: Colonoscope [id number] ?Procedure:             Colonoscopy ?Indications:           Screening for colorectal malignant neoplasm ?Providers:             Jonathon Bellows MD, MD ?Referring MD:          Forest Gleason Md, MD (Referring MD) ?Medicines:             Monitored Anesthesia Care ?Complications:         No immediate complications. ?Procedure:             Pre-Anesthesia Assessment: ?                       - Prior to the procedure, a History and Physical was  ?                       performed, and patient medications, allergies and  ?                       sensitivities were reviewed. The patient's tolerance  ?                       of previous anesthesia was reviewed. ?                       - The risks and benefits of the procedure and the  ?                       sedation options and risks were discussed with the  ?                       patient. All questions were answered and informed  ?                       consent was obtained. ?                       - ASA Grade Assessment: II - A patient with mild  ?                       systemic disease. ?                       After obtaining informed consent, the colonoscope was  ?                       passed under direct vision. Throughout the procedure,  ?                       the patient's blood pressure, pulse, and oxygen  ?                       saturations were monitored continuously. The  ?                       Colonoscope was  introduced through the anus and  ?                       advanced to the the cecum, identified by the  ?                       appendiceal orifice. The colonoscopy was performed  ?                       with ease. The patient tolerated the procedure well.  ?                        The quality of the bowel preparation was excellent. ?Findings: ?     The entire examined colon appeared normal on direct and retroflexion  ?     views. ?Impression:            - The entire examined colon is normal on direct and  ?                       retroflexion views. ?                       - No specimens collected. ?Recommendation:        - Discharge patient to home (with escort). ?                       - Resume previous diet. ?                       - Continue present medications. ?                       - Repeat colonoscopy in 10 years for screening  ?                       purposes. ?Procedure Code(s):     --- Professional --- ?                       [phone], Colonoscopy, flexible; diagnostic, including  ?                       collection of specimen(s) by brushing or washing, when  ?                       performed (separate procedure) ?Diagnosis Code(s):     --- Professional --- ?                       Z12.11, Encounter for screening for malignant neoplasm  ?                       of colon ?CPT copyright 2019 American Medical Association. All rights reserved. ?The codes documented in this report are preliminary and upon coder review may  ?be revised to meet current compliance requirements. ?Jonathon Bellows, MD ?Jonathon Bellows MD, MD ?04/18/2022 11:02:04 AM ?This report has been signed electronically. ?Number of Addenda: 0 ?Note Initiated On: 04/18/2022 10:44 AM ?Scope Withdrawal Time: 0 hours 7 minutes 10 seconds  ?Total Procedure Duration: 0 hours 8 minutes 44 seconds  ?Estimated Blood Loss:  Estimated blood loss: none. ?  [facility] ?

## 2022-04-18 NOTE — H&P (Signed)
? ? ? ?Jonathon Bellows, MD ?[address] [place], [place], [place], [address] ?[address] [place], [place], [place], [address] ?Phone: [phone]  ?Fax: [phone] ? ?Primary Care Physician:  Mikey Kirschner, PA-C ? ? ?Pre-Procedure History & Physical: ?HPI:  Alexa Osborne is a 46 y.o. female is here for an colonoscopy. ?  ?Past Medical History:  ?Diagnosis Date  ? Acid reflux   ? Allergy   ? Anxiety   ? Asthma   ? Depression   ? IDA (iron deficiency anemia)   ? ? ?Past Surgical History:  ?Procedure Laterality Date  ? DILATION AND CURETTAGE OF UTERUS    ? TONSILLECTOMY    ? TUBAL LIGATION    ? ? ?Prior to Admission medications   ?Medication Sig Start Date End Date Taking? Authorizing Provider  ?Semaglutide,0.25 or 0.'5MG'$ /DOS, 2 MG/1.5ML SOPN Inject 0.25 mg into the skin once a week. Inject 0.25 mg subq once a week for four weeks and then increase to 0.5 mg once weekly for four weeks 03/08/22   Drubel, Ria Comment, PA-C  ?albuterol (VENTOLIN HFA) 108 (90 Base) MCG/ACT inhaler Inhale 2 puffs into the lungs every 4 (four) hours as needed for wheezing or shortness of breath. 02/12/22   Triplett, Johnette Abraham B, FNP  ?docusate sodium (COLACE) 100 MG capsule Take 1 capsule (100 mg total) by mouth 2 (two) times daily. ?Patient not taking: Reported on 03/07/2022 09/15/21 09/15/22  Arta Silence, MD  ?escitalopram (LEXAPRO) 10 MG tablet Take 1 tablet (10 mg total) by mouth daily. 02/21/22   Mikey Kirschner, PA-C  ?pantoprazole (PROTONIX) 40 MG tablet Take 1 tablet (40 mg total) by mouth daily. 02/21/22 08/20/22  Mikey Kirschner, PA-C  ?polyethylene glycol-electrolytes (NULYTELY) 420 g solution See admin instructions. ?Patient not taking: Reported on 03/07/2022 02/21/22   [provider]  ? ? ?Allergies as of 02/21/2022  ? (No Known Allergies)  ? ? ?Family History  ?Problem Relation Age of Onset  ? Diabetes Mother   ? Kidney failure Mother   ? Hypertension Mother   ? Heart failure Mother   ? Hypertension Father   ? Diabetes  Father   ? Gout Father   ? ? ?Social History  ? ?Socioeconomic History  ? Marital status: Single  ?  Spouse name: Not on file  ? Number of children: Not on file  ? Years of education: Not on file  ? Highest education level: Not on file  ?Occupational History  ? Not on file  ?Tobacco Use  ? Smoking status: Every Day  ?  Packs/day: 1.00  ?  Types: Cigarettes  ? Smokeless tobacco: Never  ?Vaping Use  ? Vaping Use: Never used  ?Substance and Sexual Activity  ? Alcohol use: No  ? Drug use: No  ? Sexual activity: Yes  ?Other Topics Concern  ? Not on file  ?Social History Narrative  ? Not on file  ? ?Social Determinants of Health  ? ?Financial Resource Strain: Not on file  ?Food Insecurity: Not on file  ?Transportation Needs: Not on file  ?Physical Activity: Not on file  ?Stress: Not on file  ?Social Connections: Not on file  ?Intimate Partner Violence: Not on file  ? ? ?Review of Systems: ?See HPI, otherwise negative ROS ? ?Physical Exam: ?There were no vitals taken for this visit. ?General:   Alert,  pleasant and cooperative in NAD ?Head:  Normocephalic and atraumatic. ?Neck:  Supple; no masses or thyromegaly. ?Lungs:  Clear throughout to auscultation, normal  respiratory effort.    ?Heart:  +S1, +S2, Regular rate and rhythm, No edema. ?Abdomen:  Soft, nontender and nondistended. Normal bowel sounds, without guarding, and without rebound.   ?Neurologic:  Alert and  oriented x4;  grossly normal neurologically. ? ?Impression/Plan: ?Alexa Osborne is here for an colonoscopy to be performed for Screening colonoscopy average risk   ?Risks, benefits, limitations, and alternatives regarding  colonoscopy have been reviewed with the patient.  Questions have been answered.  All parties agreeable. ? ? ?Jonathon Bellows, MD  04/18/2022, 10:22 AM ? ?

## 2022-04-18 NOTE — Anesthesia Procedure Notes (Signed)
Date/Time: 04/18/2022 10:45 AM ?Performed by: Gentry Fitz, CRNA ?Pre-anesthesia Checklist: Patient identified, Emergency Drugs available, Suction available, Patient being monitored and Timeout performed ?Patient Re-evaluated:Patient Re-evaluated prior to induction ?Oxygen Delivery Method: Nasal cannula ?Comments: Patient with SV throughout case ? ? ? ? ?

## 2022-04-18 NOTE — Anesthesia Preprocedure Evaluation (Addendum)
Anesthesia Evaluation  ?Patient identified by MRN, date of birth, ID band ?Patient awake ? ? ? ?Reviewed: ?Allergy & Precautions, NPO status , Patient's Chart, lab work & pertinent test results ? ?Airway ?Mallampati: III ? ?TM Distance: >3 FB ?Neck ROM: full ? ? ? Dental ?no notable dental hx. ? ?  ?Pulmonary ?asthma , Current SmokerPatient did not abstain from smoking.,  ?  ?Pulmonary exam normal ? ? ? ? ? ? ? Cardiovascular ?Exercise Tolerance: Poor ?Normal cardiovascular exam ? ? ?  ?Neuro/Psych ?PSYCHIATRIC DISORDERS Anxiety negative neurological ROS ?   ? GI/Hepatic ?Neg liver ROS, GERD  Controlled and Medicated,  ?Endo/Other  ?negative endocrine ROS ? Renal/GU ?negative Renal ROS  ?negative genitourinary ?  ?Musculoskeletal ? ? Abdominal ?(+) + obese,   ?Peds ? Hematology ? ?(+) Blood dyscrasia, anemia ,   ?Anesthesia Other Findings ?Past Medical History: ?No date: Acid reflux ?No date: Allergy ?No date: Anxiety ?No date: Asthma ?No date: Depression ?No date: IDA (iron deficiency anemia) ? ?Past Surgical History: ?No date: DILATION AND CURETTAGE OF UTERUS ?No date: TONSILLECTOMY ?No date: TUBAL LIGATION ? ? ? ? Reproductive/Obstetrics ?negative OB ROS ? ?  ? ? ? ? ? ? ? ? ? ? ? ? ? ?  ?  ? ? ? ? ? ? ? ?Anesthesia Physical ?Anesthesia Plan ? ?ASA: 3 ? ?Anesthesia Plan: General  ? ?Post-op Pain Management:   ? ?Induction:  ? ?PONV Risk Score and Plan: Propofol infusion, TIVA and Treatment may vary due to age or medical condition ? ?Airway Management Planned: Natural Airway and Simple Face Mask ? ?Additional Equipment:  ? ?Intra-op Plan:  ? ?Post-operative Plan:  ? ?Informed Consent: I have reviewed the patients History and Physical, chart, labs and discussed the procedure including the risks, benefits and alternatives for the proposed anesthesia with the patient or authorized representative who has indicated his/her understanding and acceptance.  ? ? ? ?Dental advisory  given ? ?Plan Discussed with: Anesthesiologist, CRNA and Surgeon ? ?Anesthesia Plan Comments:   ? ? ? ? ? ?Anesthesia Quick Evaluation ? ?

## 2022-04-18 NOTE — Transfer of Care (Signed)
Immediate Anesthesia Transfer of Care Note ? ?Patient: Alexa Osborne ? ?Procedure(s) Performed: COLONOSCOPY WITH PROPOFOL ? ?Patient Location: PACU ? ?Anesthesia Type:General ? ?Level of Consciousness: awake, alert , oriented and drowsy ? ?Airway & Oxygen Therapy: Patient Spontanous Breathing and Patient connected to nasal cannula oxygen ? ?Post-op Assessment: Report given to RN and Post -op Vital signs reviewed and stable ? ?Post vital signs: Reviewed and stable ? ?Last Vitals:  ?Vitals Value Taken Time  ?BP 109/72 04/18/22 1102  ?Temp 35.8 ?C 04/18/22 1102  ?Pulse 90 04/18/22 1103  ?Resp 19 04/18/22 1103  ?SpO2 98 % 04/18/22 1103  ?Vitals shown include unvalidated device data. ? ?Last Pain:  ?Vitals:  ? 04/18/22 1102  ?TempSrc: Temporal  ?PainSc: Asleep  ?   ? ?  ? ?Complications: No notable events documented. ?

## 2022-04-18 NOTE — Anesthesia Postprocedure Evaluation (Signed)
Anesthesia Post Note ? ?Patient: Alexa Osborne ? ?Procedure(s) Performed: COLONOSCOPY WITH PROPOFOL ? ?Patient location during evaluation: Endoscopy ?Anesthesia Type: General ?Level of consciousness: awake and alert ?Pain management: pain level controlled ?Vital Signs Assessment: post-procedure vital signs reviewed and stable ?Respiratory status: spontaneous breathing, nonlabored ventilation and respiratory function stable ?Cardiovascular status: blood pressure returned to baseline and stable ?Postop Assessment: no apparent nausea or vomiting ?Anesthetic complications: no ? ? ?No notable events documented. ? ? ?Last Vitals:  ?Vitals:  ? 04/18/22 1039 04/18/22 1102  ?BP: 126/83   ?Pulse: 92   ?Resp: 20   ?Temp: (!) 35.6 ?C (!) 35.8 ?C  ?SpO2: 98%   ?  ?Last Pain:  ?Vitals:  ? 04/18/22 1122  ?TempSrc:   ?PainSc: 0-No pain  ? ? ?  ?  ?  ?  ?  ?  ? ?[place] Ouch ? ? ? ? ?

## 2022-04-19 ENCOUNTER — Encounter: Payer: Self-pay | Admitting: Gastroenterology

## 2022-04-20 ENCOUNTER — Inpatient Hospital Stay: Payer: 59 | Admitting: Oncology

## 2022-04-24 IMAGING — DX DG CHEST 1V
1 series · 1 of 1 positions shown · non-contrast
Comparison: CT chest dated September 15, 2021

CLINICAL DATA: Cough, wheezing, sore throat.

EXAM:
CHEST  1 VIEW

[chest ap]
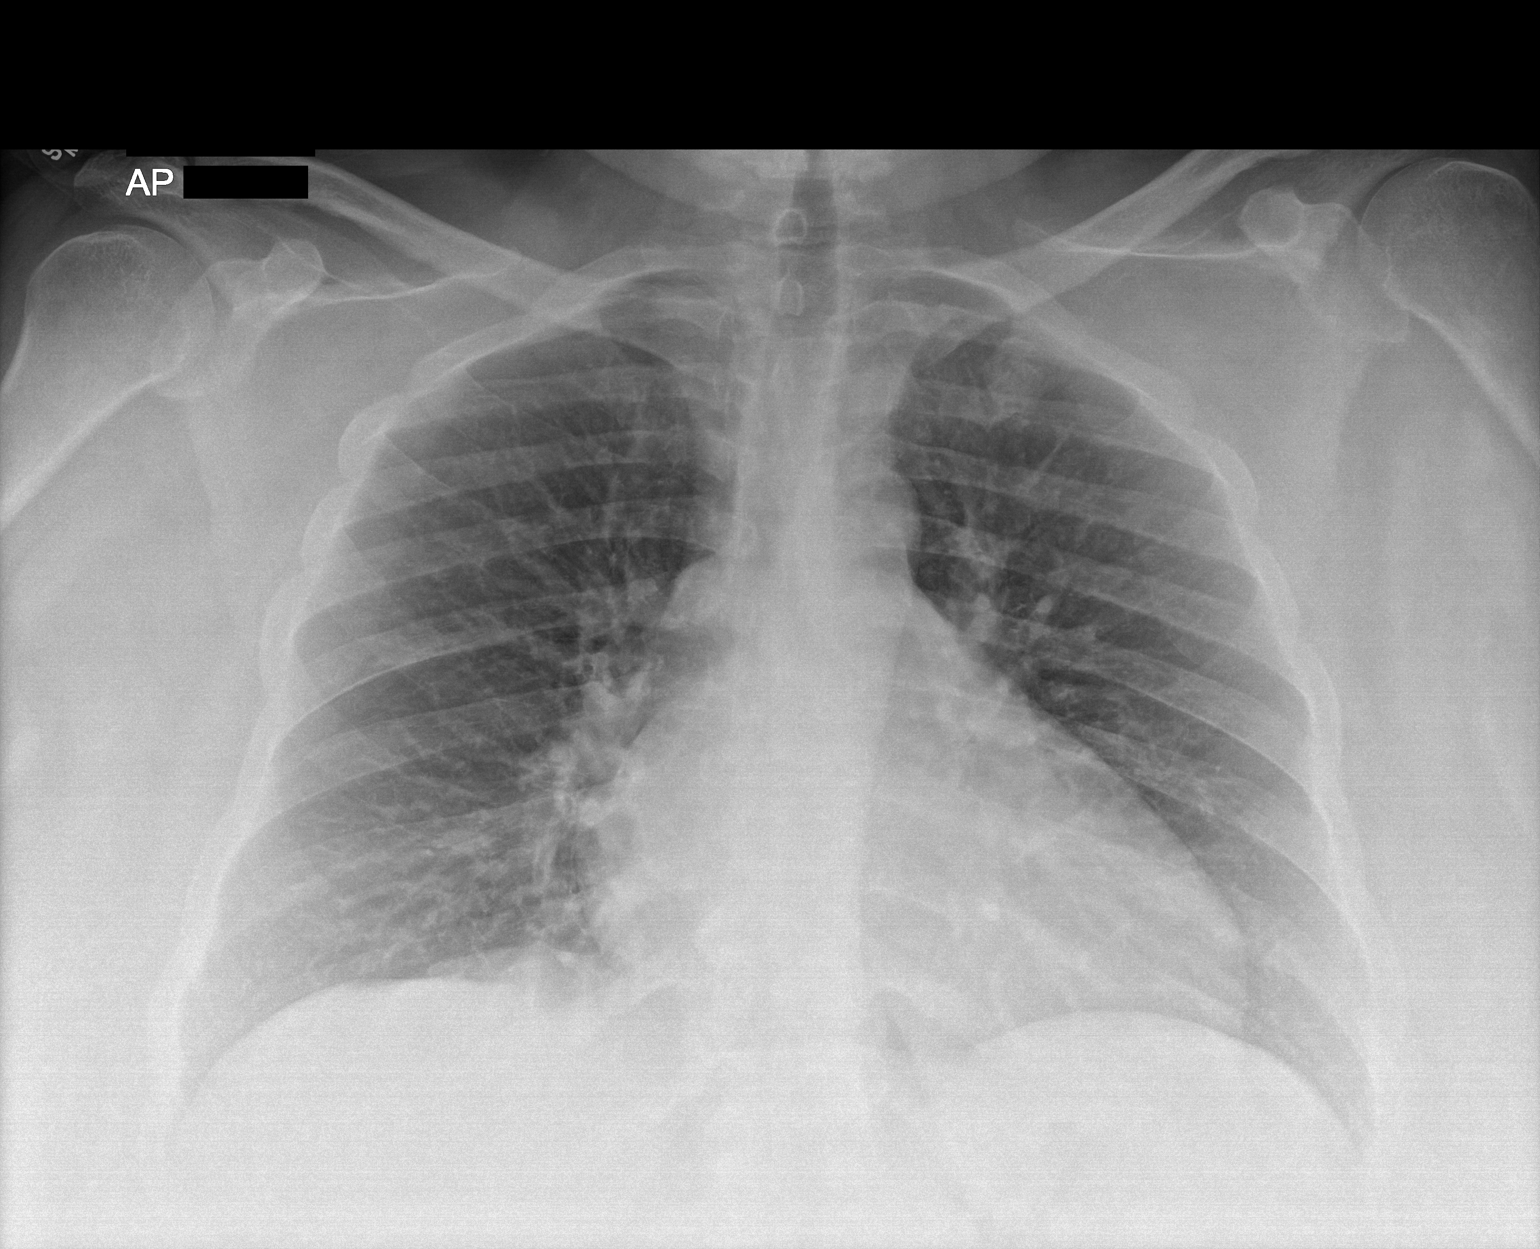

[1 of 1 positions shown; findings below may reference images not displayed]

FINDINGS: The heart size and mediastinal contours are within normal limits.
Prominent bronchovascular markings, which may be seen in viral
bronchitis. No focal consolidation or large pleural effusion. The
visualized skeletal structures are unremarkable.
IMPRESSION: Prominent bronchovascular markings, which may be seen in viral
bronchitis. No focal consolidation or pleural effusion.

## 2022-04-26 ENCOUNTER — Inpatient Hospital Stay: Payer: 59 | Admitting: Oncology

## 2022-04-26 ENCOUNTER — Encounter: Payer: Self-pay | Admitting: Physician Assistant

## 2022-04-26 ENCOUNTER — Ambulatory Visit (INDEPENDENT_AMBULATORY_CARE_PROVIDER_SITE_OTHER): Payer: 59 | Admitting: Physician Assistant

## 2022-04-26 VITALS — BP 103/66 | HR 76 | Temp 98.3°F | Resp 16 | Wt 319.1 lb

## 2022-04-26 DIAGNOSIS — B309 Viral conjunctivitis, unspecified: Secondary | ICD-10-CM | POA: Diagnosis not present

## 2022-04-26 DIAGNOSIS — J301 Allergic rhinitis due to pollen: Secondary | ICD-10-CM | POA: Diagnosis not present

## 2022-04-26 NOTE — Progress Notes (Signed)
I,[name],acting as a Education administrator for Alexa Sachs, PA-C.,have documented all relevant documentation on the behalf of Alexa Speak, PA-C,as directed by  Alexa Sachs, PA-C while in the presence of Alexa Sachs, PA-C.    Established patient visit   Patient: Alexa Osborne   DOB: June 08, 1976   46 y.o. Female  MRN: [id number] Visit Date: 04/26/2022  Today's healthcare provider: [name], PA-C   Chief Complaint  Patient presents with   Conjunctivitis    "possible pink eye " Subjective    Patient presents for right eye redness, irritation, itching and watering with onset of yesterday. Negative for pain, no blurred vision, discharge.  Reports she has not been around anyone with pink eye to her knowledge.  Works in a group home. 2 mo ago, she had bronchitis. Denies changes in visual acuity Endorses a slight swelling of the eye this morning.   Medications: Outpatient Medications Prior to Visit  Medication Sig   albuterol (VENTOLIN HFA) 108 (90 Base) MCG/ACT inhaler Inhale 2 puffs into the lungs every 4 (four) hours as needed for wheezing or shortness of breath.   docusate sodium (COLACE) 100 MG capsule Take 1 capsule (100 mg total) by mouth 2 (two) times daily.   escitalopram (LEXAPRO) 10 MG tablet Take 1 tablet (10 mg total) by mouth daily.   pantoprazole (PROTONIX) 40 MG tablet Take 1 tablet (40 mg total) by mouth daily.   Semaglutide,0.25 or 0.'5MG'$ /DOS, 2 MG/1.5ML SOPN Inject 0.25 mg into the skin once a week. Inject 0.25 mg subq once a week for four weeks and then increase to 0.5 mg once weekly for four weeks (Patient not taking: Reported on 04/26/2022)   polyethylene glycol-electrolytes (NULYTELY) 420 g solution See admin instructions. (Patient not taking: Reported on 04/26/2022)   No facility-administered medications prior to visit.    Review of Systems  HENT:  Positive for congestion and rhinorrhea. Negative for ear discharge, ear pain and sinus pain.   Eyes:  Positive  for photophobia, discharge, redness and itching. Negative for pain and visual disturbance.      Objective    BP 103/66 (BP Location: Right Arm, Patient Position: Sitting, Cuff Size: Large)   Pulse 76   Temp 98.3 F (36.8 C) (Oral)   Resp 16   Wt (!) 319 lb 1.6 oz (144.7 kg)   SpO2 100%   BMI 56.53 kg/m    Physical Exam Vitals reviewed.  Constitutional:      Appearance: Normal appearance.  HENT:     Head: Normocephalic and atraumatic.     Right Ear: Tympanic membrane, ear canal and external ear normal.     Left Ear: Tympanic membrane, ear canal and external ear normal.     Nose: Congestion and rhinorrhea present.     Mouth/Throat:     Comments: Postnasal drainage Eyes:     General:        Right eye: Discharge present.     Extraocular Movements: Extraocular movements intact.     Pupils: Pupils are equal, round, and reactive to light.     Comments: Injected conjunctiva, right eye No eye swelling noted Watery discharge appreciated.  Cardiovascular:     Rate and Rhythm: Normal rate and regular rhythm.     Pulses: Normal pulses.     Heart sounds: Normal heart sounds.  Pulmonary:     Effort: Pulmonary effort is normal.     Breath sounds: Normal breath sounds.  Lymphadenopathy:     Cervical: Cervical adenopathy  present.  Neurological:     Mental Status: She is alert and oriented to person, place, and time.  Psychiatric:        Mood and Affect: Mood normal.        Behavior: Behavior normal.        Thought Content: Thought content normal.        Judgment: Judgment normal.      No results found for any visits on 04/26/22.  Assessment & Plan     1. Viral conjunctivitis of right eye OTC decongestant/antihistamines eye drops up to 4 times per day for 3 weeks, warm compresses with tea bags If symptoms worsen, RTC Work note provided and pt  was informed that she will be contagious until she has tearing and matted eyes.  2. Seasonal allergic rhinitis due to pollen OTC  antihistamines, nasal saline rinse and OTC Flonase  FU PRN The patient was advised to call back or seek an in-person evaluation if the symptoms worsen or if the condition fails to improve as anticipated.  I discussed the assessment and treatment plan with the patient. The patient was provided an opportunity to ask questions and all were answered. The patient agreed with the plan and demonstrated an understanding of the instructions.  The entirety of the information documented in the History of Present Illness, Review of Systems and Physical Exam were personally obtained by me. Portions of this information were initially documented by the CMA and reviewed by me for thoroughness and accuracy.  Portions of this note were created using dictation software and may contain typographical errors.     Alexa Speak, PA-C  [facility] [phone] (phone) [phone] (fax)  [place]

## 2022-05-02 ENCOUNTER — Ambulatory Visit (INDEPENDENT_AMBULATORY_CARE_PROVIDER_SITE_OTHER): Payer: 59 | Admitting: Physician Assistant

## 2022-05-02 ENCOUNTER — Encounter: Payer: Self-pay | Admitting: Physician Assistant

## 2022-05-02 VITALS — BP 135/83 | HR 71 | Ht 63.0 in | Wt 319.6 lb

## 2022-05-02 DIAGNOSIS — F411 Generalized anxiety disorder: Secondary | ICD-10-CM

## 2022-05-02 DIAGNOSIS — F322 Major depressive disorder, single episode, severe without psychotic features: Secondary | ICD-10-CM | POA: Diagnosis not present

## 2022-05-02 DIAGNOSIS — K219 Gastro-esophageal reflux disease without esophagitis: Secondary | ICD-10-CM

## 2022-05-02 DIAGNOSIS — R7303 Prediabetes: Secondary | ICD-10-CM | POA: Diagnosis not present

## 2022-05-02 MED ORDER — PANTOPRAZOLE SODIUM 40 MG PO TBEC: 40.0000 mg | DELAYED_RELEASE_TABLET | Freq: Every day | ORAL | 1 refills | Status: DC

## 2022-05-02 MED ORDER — ESCITALOPRAM OXALATE 20 MG PO TABS: 20.0000 mg | ORAL_TABLET | Freq: Every day | ORAL | 1 refills | Status: AC

## 2022-05-02 MED ORDER — METFORMIN HCL 500 MG PO TABS
500.0000 mg | ORAL_TABLET | Freq: Two times a day (BID) | ORAL | 3 refills | Status: DC
Start: 2022-05-02 — End: 2022-07-05

## 2022-05-02 NOTE — Assessment & Plan Note (Signed)
Insurance does not cover wegovy or any other medical weight loss product

## 2022-05-02 NOTE — Assessment & Plan Note (Signed)
Discussed metformin for obesity/prediabeties Start with metformin 500 mg BID and increase gradually to goal of 1000 mg BID. Advised possible SE F/u 3 mo

## 2022-05-02 NOTE — Assessment & Plan Note (Signed)
Pulled for refill 

## 2022-05-02 NOTE — Progress Notes (Signed)
I,Sha'taria Tyson,acting as a Education administrator for [employer], PA-C.,have documented all relevant documentation on the behalf of Mikey Kirschner, PA-C,as directed by  Mikey Kirschner, PA-C while in the presence of Mikey Kirschner, PA-C.   Established patient visit   Patient: Alexa Osborne   DOB: 1976-01-28   46 y.o. Female  MRN: [id number] Visit Date: 05/02/2022  Today's healthcare provider: Mikey Kirschner, PA-C   No chief complaint on file.  Subjective    HPI  Anxiety, Follow-up  She was last seen for anxiety 2 months ago. Changes made at last visit include restarted lexapro 10 mg,.   She reports excellent compliance with treatment. She reports fair tolerance of treatment. She is not having side effects.   She feels her anxiety is moderate and Improved since last visit. Current symptoms of depression include: fatigue, feelings of worthlessness/guilt, and decreased appetite She feels she is Unchanged since last visit.  Symptoms: No chest pain No difficulty concentrating  No dizziness Yes fatigue  No feelings of losing control No insomnia  Yes irritable No palpitations  No panic attacks Yes racing thoughts  No shortness of breath No sweating  No tremors/shakes    GAD-7 Results    05/02/2022   11:03 AM 02/21/2022    2:31 PM 11/13/2020    1:48 PM  GAD-7 Generalized Anxiety Disorder Screening Tool  1. Feeling Nervous, Anxious, or on Edge '1 3 3  '$ 2. Not Being Able to Stop or Control Worrying '2 3 3  '$ 3. Worrying Too Much About Different Things '2 3 3  '$ 4. Trouble Relaxing '3 2 2  '$ 5. Being So Restless it's Hard To Sit Still '2 2 2  '$ 6. Becoming Easily Annoyed or Irritable '2 3 3  '$ 7. Feeling Afraid As If Something Awful Might Happen '2 3 3  '$ Total GAD-7 Score '14 19 19  '$ Difficulty At Work, Home, or Getting  Along With Others? Somewhat difficult Extremely difficult Very difficult    PHQ-9 Scores    05/02/2022   11:04 AM 02/21/2022    2:31 PM 11/13/2020    9:58 AM  PHQ9 SCORE ONLY   PHQ-9 Total Score '12 25 22    '$ --------------------------------------------------------------------------------------------------- Medications: Outpatient Medications Prior to Visit  Medication Sig   albuterol (VENTOLIN HFA) 108 (90 Base) MCG/ACT inhaler Inhale 2 puffs into the lungs every 4 (four) hours as needed for wheezing or shortness of breath.   docusate sodium (COLACE) 100 MG capsule Take 1 capsule (100 mg total) by mouth 2 (two) times daily.   [DISCONTINUED] escitalopram (LEXAPRO) 10 MG tablet Take 1 tablet (10 mg total) by mouth daily.   [DISCONTINUED] pantoprazole (PROTONIX) 40 MG tablet Take 1 tablet (40 mg total) by mouth daily.   [DISCONTINUED] Semaglutide,0.25 or 0.'5MG'$ /DOS, 2 MG/1.5ML SOPN Inject 0.25 mg into the skin once a week. Inject 0.25 mg subq once a week for four weeks and then increase to 0.5 mg once weekly for four weeks   polyethylene glycol-electrolytes (NULYTELY) 420 g solution See admin instructions. (Patient not taking: Reported on 04/26/2022)   No facility-administered medications prior to visit.    Review of Systems  Constitutional:  Negative for fatigue and fever.  Respiratory:  Negative for cough and shortness of breath.   Cardiovascular:  Negative for chest pain and leg swelling.  Gastrointestinal:  Negative for abdominal pain.  Neurological:  Negative for dizziness and headaches.  Psychiatric/Behavioral:  The patient is nervous/anxious.      Objective    BP 135/83  Pulse 71   Ht '5\' 3"'$  (1.6 m)   Wt (!) 319 lb 9.6 oz (145 kg)   SpO2 100%   BMI 56.61 kg/m    Physical Exam Vitals reviewed.  Constitutional:      Appearance: She is not ill-appearing.  HENT:     Head: Normocephalic.  Eyes:     Conjunctiva/sclera: Conjunctivae normal.  Cardiovascular:     Rate and Rhythm: Normal rate.  Pulmonary:     Effort: Pulmonary effort is normal. No respiratory distress.  Neurological:     General: No focal deficit present.     Mental Status: She is  alert and oriented to person, place, and time.  Psychiatric:        Mood and Affect: Mood normal.        Behavior: Behavior normal.     No results found for any visits on 05/02/22.  Assessment & Plan     Problem List Items Addressed This Visit       Digestive   Gastroesophageal reflux disease    Pulled for refill       Relevant Medications   pantoprazole (PROTONIX) 40 MG tablet     Other   Morbid obesity ([place])    Insurance does not cover wegovy or any other medical weight loss product       Relevant Medications   metFORMIN (GLUCOPHAGE) 500 MG tablet   GAD (generalized anxiety disorder) - Primary    Improved w/ lexapro 10 mg, but only slightly advised increasing to 20 mg daily       Relevant Medications   escitalopram (LEXAPRO) 20 MG tablet   Depression, major, single episode, severe (HCC)    Improved with lexapro 10 mg, but recommending increasing to 20 mg daily.       Relevant Medications   escitalopram (LEXAPRO) 20 MG tablet   Prediabetes    Discussed metformin for obesity/prediabeties Start with metformin 500 mg BID and increase gradually to goal of 1000 mg BID. Advised possible SE F/u 3 mo       Relevant Medications   metFORMIN (GLUCOPHAGE) 500 MG tablet     Return in about 3 months (around 08/02/2022) for prediabetes, anxiety, depression.      I, Mikey Kirschner, PA-C have reviewed all documentation for this visit. The documentation on  05/02/2022 for the exam, diagnosis, procedures, and orders are all accurate and complete.   Mikey Kirschner, PA-C [facility] [address] [place], [place], [address] Office: [phone] Fax: [place]

## 2022-05-02 NOTE — Assessment & Plan Note (Signed)
Improved w/ lexapro 10 mg, but only slightly advised increasing to 20 mg daily

## 2022-05-02 NOTE — Assessment & Plan Note (Signed)
Improved with lexapro 10 mg, but recommending increasing to 20 mg daily.

## 2022-05-23 ENCOUNTER — Encounter: Payer: Self-pay | Admitting: Oncology

## 2022-05-23 ENCOUNTER — Other Ambulatory Visit: Payer: Self-pay | Admitting: Physician Assistant

## 2022-05-23 DIAGNOSIS — D508 Other iron deficiency anemias: Secondary | ICD-10-CM

## 2022-06-02 ENCOUNTER — Ambulatory Visit: Payer: 59 | Admitting: Obstetrics

## 2022-06-02 ENCOUNTER — Encounter: Payer: Self-pay | Admitting: Obstetrics

## 2022-06-02 VITALS — BP 134/81 | HR 95 | Wt 310.0 lb

## 2022-06-02 DIAGNOSIS — N920 Excessive and frequent menstruation with regular cycle: Secondary | ICD-10-CM

## 2022-06-06 ENCOUNTER — Other Ambulatory Visit: Payer: Self-pay | Admitting: Obstetrics

## 2022-06-06 ENCOUNTER — Ambulatory Visit (INDEPENDENT_AMBULATORY_CARE_PROVIDER_SITE_OTHER): Payer: 59

## 2022-06-06 DIAGNOSIS — N920 Excessive and frequent menstruation with regular cycle: Secondary | ICD-10-CM

## 2022-06-06 DIAGNOSIS — N939 Abnormal uterine and vaginal bleeding, unspecified: Secondary | ICD-10-CM

## 2022-06-08 ENCOUNTER — Encounter: Payer: Self-pay | Admitting: Obstetrics

## 2022-06-24 ENCOUNTER — Ambulatory Visit (INDEPENDENT_AMBULATORY_CARE_PROVIDER_SITE_OTHER): Payer: Self-pay | Admitting: Obstetrics and Gynecology

## 2022-06-24 ENCOUNTER — Encounter: Payer: Self-pay | Admitting: Oncology

## 2022-06-24 ENCOUNTER — Encounter: Payer: Self-pay | Admitting: Obstetrics and Gynecology

## 2022-06-24 VITALS — BP 94/68 | HR 81 | Resp 16 | Ht 63.0 in | Wt 313.0 lb

## 2022-06-24 DIAGNOSIS — D259 Leiomyoma of uterus, unspecified: Secondary | ICD-10-CM

## 2022-06-24 DIAGNOSIS — N920 Excessive and frequent menstruation with regular cycle: Secondary | ICD-10-CM

## 2022-06-24 DIAGNOSIS — Z6841 Body Mass Index (BMI) 40.0 and over, adult: Secondary | ICD-10-CM

## 2022-06-24 DIAGNOSIS — Z862 Personal history of diseases of the blood and blood-forming organs and certain disorders involving the immune mechanism: Secondary | ICD-10-CM

## 2022-06-24 MED ORDER — MYFEMBREE 40-1-0.5 MG PO TABS: 1.0000 | ORAL_TABLET | Freq: Every day | ORAL | 11 refills | Status: AC

## 2022-06-24 NOTE — Progress Notes (Unsigned)
    GYNECOLOGY PROGRESS NOTE  Subjective:    Patient ID: Alexa Osborne, female    DOB: 1976-01-13, 46 y.o.   MRN: [id number]  HPI  Patient is a 46 y.o. [id number] female who presents for evaluation of fibroids.  {Common ambulatory SmartLinks:19316}  Review of Systems {ros; complete:30496}   Objective:   Last menstrual period 05/26/2022. Body mass index is 55.45 kg/m. Blood pressure 94/68, pulse 81, resp. rate 16, height '5\' 3"'$  (1.6 m), weight (!) 313 lb (142 kg), last menstrual period 05/26/2022.  General appearance: {general exam:16600} Abdomen: {abdominal exam:16834} Pelvic: {pelvic exam:16852::"cervix normal in appearance","external genitalia normal","no adnexal masses or tenderness","no cervical motion tenderness","rectovaginal septum normal","uterus normal size, shape, and consistency","vagina normal without discharge"} Extremities: {extremity exam:5109} Neurologic: {neuro exam:17854}   Assessment:   No diagnosis found.   Plan:   There are no diagnoses linked to this encounter.    Rubie Maid, MD [facility]

## 2022-06-25 ENCOUNTER — Encounter: Payer: Self-pay | Admitting: Obstetrics and Gynecology

## 2022-06-27 ENCOUNTER — Telehealth: Payer: Self-pay | Admitting: Obstetrics and Gynecology

## 2022-06-27 ENCOUNTER — Encounter: Payer: 59 | Admitting: Certified Nurse Midwife

## 2022-06-27 NOTE — Telephone Encounter (Signed)
Pharmacy is asking if the prior authorization has been completed for RX MYFEMBREE  Pls call :   [phone] ref # [id number]

## 2022-06-29 ENCOUNTER — Telehealth: Payer: Self-pay | Admitting: Obstetrics and Gynecology

## 2022-06-29 NOTE — Telephone Encounter (Signed)
[facility] called asking to check on PA for myfembree she stated call back#  [phone]. Rx. [id number] store: [phone]

## 2022-07-04 ENCOUNTER — Telehealth: Payer: Self-pay | Admitting: Physician Assistant

## 2022-07-04 NOTE — Telephone Encounter (Signed)
Pt called saying she needs a refill and new prescription for her Metformin.  She is taking 2 500 mg of Metformin in the am and 2 in the pm.  Total of 2000 mg's a day.  Her old prescription states 1 500 mg in the am and 1 500 mg in the pm.  Ria Comment changed that back in June.  [employer]  [phone]  [phone]

## 2022-07-05 ENCOUNTER — Telehealth: Payer: Self-pay | Admitting: Certified Nurse Midwife

## 2022-07-05 ENCOUNTER — Other Ambulatory Visit: Payer: Self-pay | Admitting: Physician Assistant

## 2022-07-05 DIAGNOSIS — R7303 Prediabetes: Secondary | ICD-10-CM

## 2022-07-05 MED ORDER — METFORMIN HCL 500 MG PO TABS: 1000.0000 mg | ORAL_TABLET | Freq: Two times a day (BID) | ORAL | 3 refills | Status: AC

## 2022-07-05 NOTE — Telephone Encounter (Signed)
Faxed RX request with provider signature on 07/05/2024 to pharmacy

## 2022-07-05 NOTE — Telephone Encounter (Signed)
Prior authorization approval. [id number] ref [id number]

## 2022-07-07 ENCOUNTER — Telehealth: Payer: Self-pay | Admitting: Certified Nurse Midwife

## 2022-07-07 NOTE — Telephone Encounter (Signed)
[facility] states they submitted a prior authorization on 07/14 for insurance , but has not receive the form back . Please call [phone] - use REF [id number] -  for RX: [facility]

## 2022-07-08 NOTE — Telephone Encounter (Signed)
Prior authorization has been initiated. Waiting on response.

## 2022-08-02 ENCOUNTER — Ambulatory Visit: Payer: 59 | Admitting: Physician Assistant

## 2022-08-02 NOTE — Progress Notes (Deleted)
I,Sha'taria Morena Mckissack,acting as a Education administrator for [employer], PA-C.,have documented all relevant documentation on the behalf of Mikey Kirschner, PA-C,as directed by  Mikey Kirschner, PA-C while in the presence of Mikey Kirschner, PA-C.  Established patient visit   Patient: Alexa Osborne   DOB: 1976-06-23   46 y.o. Female  MRN: [id number] Visit Date: 08/02/2022  Today's healthcare provider: Mikey Kirschner, PA-C   No chief complaint on file.  Subjective    HPI  Prediabetes, Follow-up  Lab Results  Component Value Date   HGBA1C 5.8 (H) 02/21/2022   HGBA1C 5.6 11/13/2020   GLUCOSE 88 02/21/2022   GLUCOSE 96 09/15/2021   GLUCOSE 84 11/13/2020    Last seen for for this{1-12:18279} {days/wks/mos/yrs:310907} ago.  Management since that visit includes ***. Current symptoms include {Symptoms; diabetes:14075} and have been {Desc; course:15616}.  Prior visit with dietician: {yes/no:17258} Current diet: {diet habits:16563} Current exercise: {exercise types:16438}  Pertinent Labs:    Component Value Date/Time   CHOL 261 (H) 02/21/2022 1530   TRIG 189 (H) 02/21/2022 1530   CHOLHDL 4.7 (H) 02/21/2022 1530   CREATININE 0.75 02/21/2022 1530   CREATININE 0.75 10/19/2012 0932    Wt Readings from Last 3 Encounters:  06/24/22 (!) 313 lb (142 kg)  06/02/22 (!) 310 lb (140.6 kg)  05/02/22 (!) 319 lb 9.6 oz (145 kg)    ----------------------------------------------------------------------------------------- Depression, Follow-up  She  was last seen for this {NUMBERS 1-12:18279} {days/wks/mos/yrs:310907} ago. Changes made at last visit include ***.   She reports {excellent/good/fair/poor:19665} compliance with treatment. She {is/is not:21021397} having side effects. ***  She reports {DESC; GOOD/FAIR/POOR:18685} tolerance of treatment. Current symptoms include: {Symptoms; depression:1002} She feels she is {improved/worse/unchanged:3041574} since last visit.     05/02/2022   11:04  AM 02/21/2022    2:31 PM 11/13/2020    9:58 AM  Depression screen PHQ 2/9  Decreased Interest '3 3 3  '$ Down, Depressed, Hopeless '1 3 3  '$ PHQ - 2 Score '4 6 6  '$ Altered sleeping '2 3 2  '$ Tired, decreased energy '1 3 3  '$ Change in appetite '2 3 3  '$ Feeling bad or failure about yourself  0 3 3  Trouble concentrating '2 3 2  '$ Moving slowly or fidgety/restless '1 3 2  '$ Suicidal thoughts 0 1 1  PHQ-9 Score '12 25 22  '$ Difficult doing work/chores Somewhat difficult Very difficult Extremely dIfficult    -----------------------------------------------------------------------------------------  Anxiety, Follow-up  She was last seen for anxiety {NUMBERS 1-12:18279} {days/wks/mos/yrs:310907} ago. Changes made at last visit include ***.   She reports {excellent/good/fair/poor:19665} compliance with treatment. She reports {good/fair/poor:18685} tolerance of treatment. She {is/is not:21021397} having side effects. {document side effects if present:1}  She feels her anxiety is {Desc; severity:60313} and {improved/worse/unchanged:3041574} since last visit.  Symptoms: {Yes/No:20286} chest pain {Yes/No:20286} difficulty concentrating  {Yes/No:20286} dizziness {Yes/No:20286} fatigue  {Yes/No:20286} feelings of losing control {Yes/No:20286} insomnia  {Yes/No:20286} irritable {Yes/No:20286} palpitations  {Yes/No:20286} panic attacks {Yes/No:20286} racing thoughts  {Yes/No:20286} shortness of breath {Yes/No:20286} sweating  {Yes/No:20286} tremors/shakes    GAD-7 Results    05/02/2022   11:03 AM 02/21/2022    2:31 PM 11/13/2020    1:48 PM  GAD-7 Generalized Anxiety Disorder Screening Tool  1. Feeling Nervous, Anxious, or on Edge '1 3 3  '$ 2. Not Being Able to Stop or Control Worrying '2 3 3  '$ 3. Worrying Too Much About Different Things '2 3 3  '$ 4. Trouble Relaxing '3 2 2  '$ 5. Being So Restless it's Hard To Sit Still 2 2  2  6. Becoming Easily Annoyed or Irritable '2 3 3  '$ 7. Feeling Afraid As If Something Awful Might  Happen '2 3 3  '$ Total GAD-7 Score '14 19 19  '$ Difficulty At Work, Home, or Getting  Along With Others? Somewhat difficult Extremely difficult Very difficult  ---------------------------------------------------------------------------------------------------   Medications: Outpatient Medications Prior to Visit  Medication Sig   albuterol (VENTOLIN HFA) 108 (90 Base) MCG/ACT inhaler Inhale 2 puffs into the lungs every 4 (four) hours as needed for wheezing or shortness of breath.   docusate sodium (COLACE) 100 MG capsule Take 1 capsule (100 mg total) by mouth 2 (two) times daily.   escitalopram (LEXAPRO) 20 MG tablet Take 1 tablet (20 mg total) by mouth daily.   metFORMIN (GLUCOPHAGE) 500 MG tablet Take 2 tablets (1,000 mg total) by mouth 2 (two) times daily with a meal.   pantoprazole (PROTONIX) 40 MG tablet Take 1 tablet (40 mg total) by mouth daily.   Relugolix-Estradiol-Norethind (MYFEMBREE) 40-1-0.5 MG TABS Take 1 tablet by mouth daily.   No facility-administered medications prior to visit.    Review of Systems  {Labs  Heme  Chem  Endocrine  Serology  Results Review (optional):23779}   Objective    There were no vitals taken for this visit. {Show previous vital signs (optional):23777}  Physical Exam  ***  No results found for any visits on 08/02/22.  Assessment & Plan     ***  No follow-ups on file.      {provider attestation***:1}   Mikey Kirschner, PA-C  [facility] [phone] (phone) [phone] (fax)  [place]

## 2022-08-16 ENCOUNTER — Other Ambulatory Visit: Payer: Self-pay

## 2022-08-16 ENCOUNTER — Telehealth: Payer: Self-pay | Admitting: Physician Assistant

## 2022-08-16 DIAGNOSIS — K219 Gastro-esophageal reflux disease without esophagitis: Secondary | ICD-10-CM

## 2022-08-16 MED ORDER — PANTOPRAZOLE SODIUM 40 MG PO TBEC: 40.0000 mg | DELAYED_RELEASE_TABLET | Freq: Every day | ORAL | 1 refills | Status: DC

## 2022-08-16 NOTE — Telephone Encounter (Signed)
[facility] faxed refill request for the following medications:   pantoprazole (PROTONIX) 40 MG tablet    Please advise.  

## 2022-08-22 ENCOUNTER — Encounter: Payer: Self-pay | Admitting: Physician Assistant

## 2022-09-12 ENCOUNTER — Telehealth: Payer: Self-pay | Admitting: Physician Assistant

## 2022-09-12 DIAGNOSIS — K219 Gastro-esophageal reflux disease without esophagitis: Secondary | ICD-10-CM

## 2022-09-12 MED ORDER — PANTOPRAZOLE SODIUM 40 MG PO TBEC: 40.0000 mg | DELAYED_RELEASE_TABLET | Freq: Every day | ORAL | 0 refills | Status: AC

## 2022-09-12 NOTE — Telephone Encounter (Signed)
[facility] faxed refill request for the following medications:   pantoprazole (PROTONIX) 40 MG tablet    Please advise.  

## 2023-05-23 ENCOUNTER — Other Ambulatory Visit: Payer: Self-pay | Admitting: Physician Assistant

## 2023-05-23 DIAGNOSIS — F411 Generalized anxiety disorder: Secondary | ICD-10-CM

## 2023-05-23 DIAGNOSIS — F322 Major depressive disorder, single episode, severe without psychotic features: Secondary | ICD-10-CM

## 2023-06-14 ENCOUNTER — Emergency Department: Payer: BLUE CROSS/BLUE SHIELD

## 2023-06-14 ENCOUNTER — Encounter: Payer: Self-pay | Admitting: Oncology

## 2023-06-14 ENCOUNTER — Emergency Department
Admission: EM | Admit: 2023-06-14 | Discharge: 2023-06-14 | Disposition: A | Discharge status: Home / Self Care | Payer: BLUE CROSS/BLUE SHIELD | Attending: Emergency Medicine | Admitting: Emergency Medicine

## 2023-06-14 ENCOUNTER — Other Ambulatory Visit: Payer: Self-pay

## 2023-06-14 DIAGNOSIS — R112 Nausea with vomiting, unspecified: Secondary | ICD-10-CM

## 2023-06-14 DIAGNOSIS — K529 Noninfective gastroenteritis and colitis, unspecified: Secondary | ICD-10-CM | POA: Insufficient documentation

## 2023-06-14 DIAGNOSIS — J45909 Unspecified asthma, uncomplicated: Secondary | ICD-10-CM | POA: Diagnosis not present

## 2023-06-14 LAB — URINALYSIS, ROUTINE W REFLEX MICROSCOPIC
Bilirubin Urine: NEGATIVE
Glucose, UA: NEGATIVE mg/dL
Ketones, ur: 5 mg/dL — AB
Leukocytes,Ua: NEGATIVE
Nitrite: NEGATIVE
Protein, ur: 30 mg/dL — AB
Specific Gravity, Urine: 1.024 (ref 1.005–1.030)
pH: 6 (ref 5.0–8.0)

## 2023-06-14 LAB — CBC
HCT: 36.7 % (ref 36.0–46.0)
Hemoglobin: 11.4 g/dL — ABNORMAL LOW (ref 12.0–15.0)
MCH: 24.1 pg — ABNORMAL LOW (ref 26.0–34.0)
MCHC: 31.1 g/dL (ref 30.0–36.0)
MCV: 77.4 fL — ABNORMAL LOW (ref 80.0–100.0)
Platelets: 305 10*3/uL (ref 150–400)
RBC: 4.74 MIL/uL (ref 3.87–5.11)
RDW: 16 % — ABNORMAL HIGH (ref 11.5–15.5)
WBC: 9.4 10*3/uL (ref 4.0–10.5)
nRBC: 0 % (ref 0.0–0.2)

## 2023-06-14 LAB — LIPASE, BLOOD: Lipase: 43 U/L (ref 11–51)

## 2023-06-14 LAB — COMPREHENSIVE METABOLIC PANEL
ALT: 12 U/L (ref 0–44)
AST: 15 U/L (ref 15–41)
Albumin: 3.6 g/dL (ref 3.5–5.0)
Alkaline Phosphatase: 68 U/L (ref 38–126)
Anion gap: 9 (ref 5–15)
BUN: 10 mg/dL (ref 6–20)
CO2: 22 mmol/L (ref 22–32)
Calcium: 8.7 mg/dL — ABNORMAL LOW (ref 8.9–10.3)
Chloride: 106 mmol/L (ref 98–111)
Creatinine, Ser: 0.52 mg/dL (ref 0.44–1.00)
GFR, Estimated: >60 mL/min (ref >60)
Glucose, Bld: 84 mg/dL (ref 70–99)
Potassium: 3.3 mmol/L — ABNORMAL LOW (ref 3.5–5.1)
Sodium: 137 mmol/L (ref 135–145)
Total Bilirubin: 0.7 mg/dL (ref 0.3–1.2)
Total Protein: 7 g/dL (ref 6.5–8.1)

## 2023-06-14 LAB — POC URINE PREG, ED: Preg Test, Ur: NEGATIVE

## 2023-06-14 MED ORDER — IOHEXOL 300 MG/ML  SOLN
100.0000 mL | Freq: Once | INTRAMUSCULAR | Status: AC | PRN
  Administered 2023-06-14: 100 mL via INTRAVENOUS

## 2023-06-14 MED ORDER — ONDANSETRON HCL 4 MG/2ML IJ SOLN
4.0000 mg | Freq: Once | INTRAMUSCULAR | Status: AC
  Administered 2023-06-14: 4 mg via INTRAVENOUS
  Filled 2023-06-14: qty 2

## 2023-06-14 MED ORDER — SODIUM CHLORIDE 0.9 % IV BOLUS
1000.0000 mL | Freq: Once | INTRAVENOUS | Status: AC
  Administered 2023-06-14: 1000 mL via INTRAVENOUS

## 2023-06-14 MED ORDER — ONDANSETRON HCL 4 MG PO TABS: 4.0000 mg | ORAL_TABLET | Freq: Every day | ORAL | 1 refills | Status: AC | PRN

## 2023-06-14 NOTE — ED Triage Notes (Signed)
Pt arrives via [facility] with CC of nausea/vomiting x2 days with small amount of diarrhea. Pt reports suspected "food poisoning" x8 days ago. EMS gave NS and 4mg  Zofran IV - nausea resolved at this time.

## 2023-06-14 NOTE — Discharge Instructions (Signed)
Take Zofran for nausea as needed.  Find Pedialyte or similar electrolyte rehydration form and drink throughout the day to stay hydrated.  Take acetaminophen 650 mg and ibuprofen 400 mg every 6 hours for pain.  Take with food.  Fortunately your testing in the emergency department today did not show any dangerous conditions to account for your symptoms like cholecystitis or appendicitis or problems that would require an emergency surgery.  Call your doctor for a follow-up appointment next week.

## 2023-06-14 NOTE — ED Provider Notes (Signed)
[facility] Provider Note    Event Date/Time   First MD Initiated Contact with Patient 06/14/23 1930     (approximate)   History   Emesis   HPI  Alexa Osborne is a 47 y.o. female   Past medical history of acid reflux, asthma, anxiety and depression, iron deficiency anemia, elevated BMI, who presents to the emergency department with nausea and vomiting for 2 days with some diarrhea associated.  EMS gave her 500 mL of saline and 4 mg of IV Zofran en route and patient feels better.  Symptom onset approximately 6 days ago thought to be food poisoning with symptoms of abdominal cramping pain, nausea and vomiting, worse after eating or drinking.  Symptoms seem to subside a bit on Sunday with recurred.  For small amount of diarrhea, no GI bleeding noted.  She is history of tubal ligation but no other abdominal surgeries.  She also notes a history of uterine fibroids denies vaginal bleeding.  She denies any dysuria or frequency.  Independent historians -her husband is at bedside to corroborate information given above, symptomatology, past medical history.  External Medical Documents Reviewed: Colonoscopy report from 2023 which was normal      Physical Exam   Triage Vital Signs: ED Triage Vitals  Enc Vitals Group     BP 06/14/23 1923 98/78     Pulse Rate 06/14/23 1923 86     Resp 06/14/23 1923 17     Temp 06/14/23 1923 98.6 F (37 C)     Temp Source 06/14/23 1923 Oral     SpO2 06/14/23 1923 98 %     Weight 06/14/23 1924 270 lb (122.5 kg)     Height 06/14/23 1924 5\' 3"  (1.6 m)     Head Circumference --      Peak Flow --      Pain Score 06/14/23 1923 0     Pain Loc --      Pain Edu? --      Excl. in GC? --     Most recent vital signs: Vitals:   06/14/23 1923  BP: 98/78  Pulse: 86  Resp: 17  Temp: 98.6 F (37 C)  SpO2: 98%    General: Awake, no distress.  CV:  Good peripheral perfusion.  Resp:  Normal effort.  Abd:  No distention.   Other:  Awake alert comfortable appearing laying in the stretcher.  Right upper quadrant tenderness to palpation without rigidity or guarding.  Appears nontoxic afebrile.  She does have dry mucous membranes, appears slightly dehydrated.   ED Results / Procedures / Treatments   Labs (all labs ordered are listed, but only abnormal results are displayed) Labs Reviewed  COMPREHENSIVE METABOLIC PANEL - Abnormal; Notable for the following components:      Result Value   Potassium 3.3 (*)    Calcium 8.7 (*)    All other components within normal limits  CBC - Abnormal; Notable for the following components:   Hemoglobin 11.4 (*)    MCV 77.4 (*)    MCH 24.1 (*)    RDW 16.0 (*)    All other components within normal limits  URINALYSIS, ROUTINE W REFLEX MICROSCOPIC - Abnormal; Notable for the following components:   Color, Urine YELLOW (*)    APPearance HAZY (*)    Hgb urine dipstick MODERATE (*)    Ketones, ur 5 (*)    Protein, ur 30 (*)    Bacteria, UA RARE (*)  All other components within normal limits  LIPASE, BLOOD  POC URINE PREG, ED     I ordered and reviewed the above labs they are notable for white blood cell count is normal, H&H at baseline.      RADIOLOGY I independently reviewed and interpreted RUQ [place] and see no obvious gallstones or wall thickening.    PROCEDURES:  Critical Care performed: No  Procedures   MEDICATIONS ORDERED IN ED: Medications  sodium chloride 0.9 % bolus 1,000 mL (1,000 mLs Intravenous New Bag/Given 06/14/23 2025)  ondansetron (ZOFRAN) injection 4 mg (4 mg Intravenous Given 06/14/23 2026)  iohexol (OMNIPAQUE) 300 MG/ML solution 100 mL (100 mLs Intravenous Contrast Given 06/14/23 2029)     IMPRESSION / MDM / ASSESSMENT AND PLAN / ED COURSE  I reviewed the triage vital signs and the nursing notes.                                Patient's presentation is most consistent with acute presentation with potential threat to life or bodily  function.  Differential diagnosis includes, but is not limited to, cystitis, cholelithiasis, or biliary colic, pancreatitis, gastroenteritis, appendicitis, obstruction urinary tract infection   The patient is on the cardiac monitor to evaluate for evidence of arrhythmia and/or significant heart rate changes.  MDM: This is a patient with postprandial abdominal pain nausea and vomiting for the last several days, with right upper quadrant tenderness to palpation.  Obtain basic labs, including LFTs, lipase, right upper quadrant ultrasound to assess for cholelithiasis or cholecystitis.  Consider gastroenteritis as well.  Appears slightly dehydrated will give fluid bolus and Zofran.  Check urinalysis for urinary tract infection, rule out pregnancy though doubt given tubal ligation.  Doubt GI bleeding given no reports of bleeding and normal H&H.    RUQ [place] neg, labs wnl, given tenderness to R abd will get CT r/o appy or other surgical abd pathology.   Fortunately CT scan shows no acute abdominal pathologies.  Symptoms consistent with gastroenteritis, encourage fluids, Zofran, PMD follow-up.        FINAL CLINICAL IMPRESSION(S) / ED DIAGNOSES   Final diagnoses:  Nausea vomiting and diarrhea  Gastroenteritis     Rx / DC Orders   ED Discharge Orders          Ordered    ondansetron (ZOFRAN) 4 MG tablet  Daily PRN        06/14/23 2057             Note:  This document was prepared using Dragon voice recognition software and may include unintentional dictation errors.    Pilar Jarvis, MD 06/14/23 [phone]

## 2024-07-28 ENCOUNTER — Encounter: Payer: Self-pay | Admitting: Emergency Medicine

## 2024-07-28 ENCOUNTER — Emergency Department
Admission: EM | Admit: 2024-07-28 | Discharge: 2024-07-28 | Disposition: A | Discharge status: Home / Self Care | Attending: Emergency Medicine | Admitting: Emergency Medicine

## 2024-07-28 ENCOUNTER — Other Ambulatory Visit: Payer: Self-pay

## 2024-07-28 DIAGNOSIS — M79605 Pain in left leg: Secondary | ICD-10-CM | POA: Diagnosis present

## 2024-07-28 DIAGNOSIS — M792 Neuralgia and neuritis, unspecified: Secondary | ICD-10-CM | POA: Diagnosis not present

## 2024-07-28 MED ORDER — PREDNISONE 10 MG PO TABS: 50.0000 mg | ORAL_TABLET | Freq: Every day | ORAL | 0 refills | Status: AC

## 2024-07-28 NOTE — ED Triage Notes (Signed)
 Pt reports left leg pain and numbness. PT reports the pain and numbness starts when she is walking or standing while working. Pt describes the pain as burning. Pt reports when she sits and rests the pain and numbness goes away.

## 2024-07-28 NOTE — Discharge Instructions (Signed)
 After you finish the prednisone , take Aleve  2 times per day when needed for pain.

## 2024-07-28 NOTE — ED Provider Notes (Signed)
   [facility] Provider Note    Event Date/Time   First MD Initiated Contact with Patient 07/28/24 [phone]     (approximate)   History   Leg Pain   HPI  Alexa Osborne is a 48 y.o. female with history of IDA, reflux and as listed in EMR presents to the emergency department for treatment and evaluation of pain in the left leg with associated numbness and tingling. She started a new job this morning that requires a lot of walking. Pain got continuously worse as time went on. No specific injury. No alleviating measures prior to arrival.     Physical Exam    Vitals:   07/28/24 0724  BP: 137/87  Pulse: 89  Resp: 18  Temp: 98.1 F (36.7 C)  SpO2: 97%    General: Awake, no distress.  CV:  Good peripheral perfusion.  Resp:  Normal effort.  Abd:  No distention.  Other:  Normal sensation and movement bilateral lower extremities.    ED Results / Procedures / Treatments   Labs (all labs ordered are listed, but only abnormal results are displayed)  Labs Reviewed - No data to display   EKG  Not indicated.   RADIOLOGY  Image and radiology report reviewed and interpreted by me. Radiology report consistent with the same.  Not indicated.  PROCEDURES:  Critical Care performed: No  Procedures   MEDICATIONS ORDERED IN ED:  Medications - No data to display   IMPRESSION / MDM / ASSESSMENT AND PLAN / ED COURSE   I have reviewed the triage note and vital signs. Vital signs are stable.   Differential diagnosis includes, but is not limited to, neuropathic pain, musculoskeletal strain  Patient's presentation is most consistent with acute, uncomplicated illness.  48 year old female presenting to the emergency department for treatment and evaluation of nontraumatic left leg pain that started today.  See HPI for further details.  Exam is reassuring.  She will be treated with a short course of prednisone  and advised to follow-up with her primary  care provider if symptoms or not improving over the next few days.      FINAL CLINICAL IMPRESSION(S) / ED DIAGNOSES   Final diagnoses:  Neuropathic pain     Rx / DC Orders   ED Discharge Orders          Ordered    predniSONE  (DELTASONE ) 10 MG tablet  Daily        07/28/24 0750             Note:  This document was prepared using Dragon voice recognition software and may include unintentional dictation errors.   Herlinda Kirk NOVAK, FNP 07/28/24 1539    Dicky Anes, MD 07/28/24 [phone]

## 2024-07-31 ENCOUNTER — Other Ambulatory Visit: Payer: Self-pay

## 2024-07-31 ENCOUNTER — Encounter: Payer: Self-pay | Admitting: Emergency Medicine

## 2024-07-31 ENCOUNTER — Emergency Department: Admission: EM | Admit: 2024-07-31 | Discharge: 2024-07-31 | Disposition: A | Discharge status: Home / Self Care

## 2024-07-31 ENCOUNTER — Emergency Department

## 2024-07-31 DIAGNOSIS — M5416 Radiculopathy, lumbar region: Secondary | ICD-10-CM | POA: Insufficient documentation

## 2024-07-31 DIAGNOSIS — J45909 Unspecified asthma, uncomplicated: Secondary | ICD-10-CM | POA: Insufficient documentation

## 2024-07-31 DIAGNOSIS — M79605 Pain in left leg: Secondary | ICD-10-CM | POA: Diagnosis present

## 2024-07-31 MED ORDER — CYCLOBENZAPRINE HCL 10 MG PO TABS
10.0000 mg | ORAL_TABLET | Freq: Once | ORAL | Status: AC
  Administered 2024-07-31: 10 mg via ORAL
  Filled 2024-07-31: qty 1

## 2024-07-31 MED ORDER — CYCLOBENZAPRINE HCL 10 MG PO TABS: 10.0000 mg | ORAL_TABLET | Freq: Every day | ORAL | 0 refills | Status: AC

## 2024-07-31 MED ORDER — BACLOFEN 10 MG PO TABS: 10.0000 mg | ORAL_TABLET | Freq: Two times a day (BID) | ORAL | 0 refills | Status: AC

## 2024-07-31 NOTE — ED Provider Notes (Signed)
 [facility] Provider Note    Event Date/Time   First MD Initiated Contact with Patient 07/31/24 1319     (approximate)   History   Leg Pain   HPI  Alexa Osborne is a 48 y.o. female history of asthma, depression, morbid obesity presents emergency department with continued left leg pain and numbness with tingling.  Patient states has gotten worse instead of better.  States today she almost fell at work because when the sharp pain goes down her leg it makes her leg weak.  No loss of bowel or bladder control.      Physical Exam   Triage Vital Signs: ED Triage Vitals  Encounter Vitals Group     BP 07/31/24 1142 124/79     Girls Systolic BP Percentile --      Girls Diastolic BP Percentile --      Boys Systolic BP Percentile --      Boys Diastolic BP Percentile --      Pulse Rate 07/31/24 1140 79     Resp 07/31/24 1140 18     Temp 07/31/24 1140 98 F (36.7 C)     Temp Source 07/31/24 1140 Oral     SpO2 07/31/24 1140 98 %     Weight 07/31/24 1141 298 lb (135.2 kg)     Height 07/31/24 1141 5' 3 (1.6 m)     Head Circumference --      Peak Flow --      Pain Score 07/31/24 1140 6     Pain Loc --      Pain Education --      Exclude from Growth Chart --     Most recent vital signs: Vitals:   07/31/24 1140 07/31/24 1142  BP:  124/79  Pulse: 79   Resp: 18   Temp: 98 F (36.7 C)   SpO2: 98%      General: Awake, no distress.   CV:  Good peripheral perfusion. regular rate and  rhythm Resp:  Normal effort. Lungs cta Abd:  No distention.   Other:  Lumbar spine nontender, decreased sensation noted along the left hip into the inner thigh when compared with sensation on the right.  Decreased strength in the left foot with dorsiflexion, open patient went to stand on her heels she almost fell.  Neurovascular appears to be intact   ED Results / Procedures / Treatments   Labs (all labs ordered are listed, but only abnormal results are  displayed) Labs Reviewed - No data to display   EKG     RADIOLOGY X-ray lumbar spine    PROCEDURES:   Procedures  Critical Care:  no Chief Complaint  Patient presents with   Leg Pain      MEDICATIONS ORDERED IN ED: Medications  cyclobenzaprine  (FLEXERIL ) tablet 10 mg (10 mg Oral Given 07/31/24 1355)     IMPRESSION / MDM / ASSESSMENT AND PLAN / ED COURSE  I reviewed the triage vital signs and the nursing notes.                              Differential diagnosis includes, but is not limited to, lumbar radiculopathy, cauda equina, sciatica  Patient's presentation is most consistent with acute illness / injury with system symptoms.    Medications given: Flexeril  1 p.o. given  Due to the patient having difficulty going onto her toes and heels we will go ahead and  do a lumbar spine x-ray, will consider MRI.  However do not feel the patient has cauda equina as she has no loss of bowel or bladder control.  I do feel that she has some leg weakness associated with the lumbar radiculopathy.  Currently she is taking steroids so we will give her Flexeril  here in the ED to see if there is any relief   X-ray of the lumbar spine independently reviewed interpreted by me as being negative for any acute abnormality  Patient has been feeling better after the Flexeril .  I feel that further workup at this time is not warranted.  She can follow-up with her regular doctor on Monday.  She was given a work note as she does stand all day at [employer].  Her regular doctor can give her work restrictions.  She is given a prescription for Flexeril  to take at night.  Baclofen  to take during the day.  Finish the [employer].  When she is done with the steroid she can take Tylenol  or ibuprofen for pain.  Return emergency department if worsening.  Discharged in stable condition.   FINAL CLINICAL IMPRESSION(S) / ED DIAGNOSES   Final diagnoses:  Lumbar radiculopathy     Rx / DC Orders   ED  Discharge Orders          Ordered    cyclobenzaprine  (FLEXERIL ) 10 MG tablet  Daily at bedtime        07/31/24 1503    baclofen  (LIORESAL ) 10 MG tablet  2 times daily        07/31/24 1503             Note:  This document was prepared using Dragon voice recognition software and may include unintentional dictation errors.    Gasper Devere ORN, PA-C 07/31/24 1642    Clarine Ozell LABOR, MD 08/02/24 [phone]

## 2024-07-31 NOTE — Discharge Instructions (Addendum)
 Follow-up with your doctor.  Please call for an appointment Take medication as prescribed Finish your steroid.  Once you are done with steroid you can take naproxen  or ibuprofen Return if you lose control of your bowel or bladder or are dragging the leg

## 2024-07-31 NOTE — ED Notes (Signed)
 Ambulatory with steady gait to exam room

## 2024-07-31 NOTE — ED Triage Notes (Signed)
 Patient to ED via POV for left leg pain/numbness. States a burning pain that starts in hip and shoots down. Seen for same on 8/17 but not getting better. Currently taking prednisone . Has f/u appointment on Monday. Pain worse with movement. Pt states she does not want any narcotics.
